# Patient Record
Sex: Male | Born: 1950
Health system: Southern US, Community
[De-identification: ages and names within clinical notes are randomized; demographics above are authoritative.]

## PROBLEM LIST (undated history)

## (undated) DIAGNOSIS — Z923 Personal history of irradiation: Secondary | ICD-10-CM

## (undated) DIAGNOSIS — N200 Calculus of kidney: Secondary | ICD-10-CM

## (undated) DIAGNOSIS — E119 Type 2 diabetes mellitus without complications: Secondary | ICD-10-CM

## (undated) DIAGNOSIS — H409 Unspecified glaucoma: Secondary | ICD-10-CM

## (undated) DIAGNOSIS — R221 Localized swelling, mass and lump, neck: Secondary | ICD-10-CM

## (undated) HISTORY — PX: AMPUTATION: SHX166

## (undated) HISTORY — PX: TOE SURGERY: SHX1073

## (undated) HISTORY — PX: NASAL SEPTUM SURGERY: SHX37

---

## 1983-06-04 DIAGNOSIS — N200 Calculus of kidney: Secondary | ICD-10-CM

## 1983-06-04 HISTORY — DX: Calculus of kidney: N20.0

## 1998-01-30 ENCOUNTER — Inpatient Hospital Stay: Admission: EM | Admit: 1998-01-30 | Discharge: 1998-01-31 | Payer: Self-pay | Admitting: *Deleted

## 1998-02-02 ENCOUNTER — Encounter: Admission: RE | Admit: 1998-02-02 | Discharge: 1998-02-02 | Payer: Self-pay | Admitting: Internal Medicine

## 1998-02-07 ENCOUNTER — Encounter: Admission: RE | Admit: 1998-02-07 | Discharge: 1998-05-08 | Payer: Self-pay

## 1998-03-20 ENCOUNTER — Encounter: Admission: RE | Admit: 1998-03-20 | Discharge: 1998-03-20 | Payer: Self-pay | Admitting: Hematology and Oncology

## 2001-11-21 ENCOUNTER — Emergency Department (HOSPITAL_COMMUNITY): Admission: EM | Admit: 2001-11-21 | Discharge: 2001-11-21 | Payer: Self-pay | Admitting: Emergency Medicine

## 2003-09-15 ENCOUNTER — Ambulatory Visit (HOSPITAL_COMMUNITY): Admission: RE | Admit: 2003-09-15 | Discharge: 2003-09-15 | Payer: Self-pay | Admitting: Orthopedic Surgery

## 2003-09-15 ENCOUNTER — Ambulatory Visit (HOSPITAL_BASED_OUTPATIENT_CLINIC_OR_DEPARTMENT_OTHER): Admission: RE | Admit: 2003-09-15 | Discharge: 2003-09-15 | Payer: Self-pay | Admitting: Orthopedic Surgery

## 2006-03-04 ENCOUNTER — Ambulatory Visit: Payer: Self-pay | Admitting: Cardiology

## 2006-03-26 ENCOUNTER — Ambulatory Visit: Payer: Self-pay | Admitting: Cardiology

## 2006-07-18 ENCOUNTER — Ambulatory Visit: Payer: Self-pay | Admitting: Cardiology

## 2016-12-24 DIAGNOSIS — R221 Localized swelling, mass and lump, neck: Secondary | ICD-10-CM

## 2016-12-24 DIAGNOSIS — H9113 Presbycusis, bilateral: Secondary | ICD-10-CM | POA: Insufficient documentation

## 2016-12-24 HISTORY — DX: Localized swelling, mass and lump, neck: R22.1

## 2016-12-25 ENCOUNTER — Other Ambulatory Visit: Payer: Self-pay | Admitting: Otolaryngology

## 2016-12-25 DIAGNOSIS — R221 Localized swelling, mass and lump, neck: Secondary | ICD-10-CM

## 2016-12-25 HISTORY — PX: FINE NEEDLE ASPIRATION: SHX406

## 2017-01-03 ENCOUNTER — Ambulatory Visit
Admission: RE | Admit: 2017-01-03 | Discharge: 2017-01-03 | Disposition: A | Discharge status: Home / Self Care | Payer: 59 | Source: Ambulatory Visit | Attending: Otolaryngology | Admitting: Otolaryngology

## 2017-01-03 ENCOUNTER — Other Ambulatory Visit (HOSPITAL_COMMUNITY): Payer: Self-pay | Admitting: Otolaryngology

## 2017-01-03 DIAGNOSIS — R221 Localized swelling, mass and lump, neck: Secondary | ICD-10-CM

## 2017-01-03 DIAGNOSIS — C770 Secondary and unspecified malignant neoplasm of lymph nodes of head, face and neck: Secondary | ICD-10-CM

## 2017-01-03 MED ORDER — IOPAMIDOL (ISOVUE-300) INJECTION 61%
75.0000 mL | Freq: Once | INTRAVENOUS | Status: AC | PRN
  Administered 2017-01-03: 75 mL via INTRAVENOUS

## 2017-01-14 ENCOUNTER — Encounter (HOSPITAL_COMMUNITY)
Admission: RE | Admit: 2017-01-14 | Discharge: 2017-01-14 | Disposition: A | Discharge status: Home / Self Care | Payer: 59 | Source: Ambulatory Visit | Attending: Otolaryngology | Admitting: Otolaryngology

## 2017-01-14 DIAGNOSIS — C770 Secondary and unspecified malignant neoplasm of lymph nodes of head, face and neck: Secondary | ICD-10-CM | POA: Diagnosis present

## 2017-01-14 LAB — GLUCOSE, CAPILLARY: Glucose-Capillary: 171 mg/dL — ABNORMAL HIGH (ref 65–99)

## 2017-01-14 MED ORDER — FLUDEOXYGLUCOSE F - 18 (FDG) INJECTION
9.0000 | Freq: Once | INTRAVENOUS | Status: AC | PRN
  Administered 2017-01-14: 9 via INTRAVENOUS

## 2017-01-16 ENCOUNTER — Encounter: Payer: Self-pay | Admitting: Radiation Oncology

## 2017-01-22 NOTE — Progress Notes (Signed)
Head and Neck Cancer Location of Tumor / Histology:  12/25/16 Diagnosis CONSULT SLIDE, LEFT NECK LEVEL 2 MALIGNANT CELLS PRESENT, CONSISTENT WITH SQUAMOUS CELL CARCINOMA.  Patient presented  months ago with symptoms of: He noticed a lump in his neck recently mid to late June.   Biopsies of  revealed:   Nutrition Status Yes No Comments  Weight changes? []  [x]    Swallowing concerns? []  [x]    PEG? []  [x]     Referrals Yes No Comments  Social Work? []  [x]    Dentistry? []  [x]    Swallowing therapy? []  [x]    Nutrition? []  [x]    Med/Onc? [x]  []  Dr. Irene Limbo 02/11/17   Safety Issues Yes No Comments  Prior radiation? []  [x]    Pacemaker/ICD? []  [x]    Possible current pregnancy? []  [x]    Is the patient on methotrexate? []  [x]     Tobacco/Marijuana/Snuff/ETOH use: He is a former smoker and former smokeless tobacco user, he quit 30 years ago.   Past/Anticipated interventions by otolaryngology, if any: 12/25/16  Dr. Constance Holster performed fine needle aspiration.  No appointments are scheduled.   Past/Anticipated interventions by medical oncology, if any:  Dr. Irene Limbo 02/11/17   Current Complaints / other details:   PET 01/14/17 IMPRESSION: 1. Focal area of FDG uptake in the left lateral pharynx/upper left tonsillar region likely representing the primary neoplasm. 2. Metastatic hypermetabolic left level 2 neck nodes. No other FDG positive nodal stations or contralateral adenopathy. 3. No findings for metastatic disease.  BP (!) 145/78   Pulse 80   Temp 98.2 F (36.8 C)   Ht 5\' 9"  (1.753 m)   Wt 178 lb 3.2 oz (80.8 kg)   SpO2 100% Comment: room air  BMI 26.32 kg/m    Wt Readings from Last 3 Encounters:  01/28/17 178 lb 3.2 oz (80.8 kg)

## 2017-01-23 ENCOUNTER — Encounter: Payer: Self-pay | Admitting: Radiation Oncology

## 2017-01-27 ENCOUNTER — Telehealth: Payer: Self-pay | Admitting: Oncology

## 2017-01-27 NOTE — Telephone Encounter (Signed)
Appt has been scheduled for the pt to see Dr. Alen Blew on 9/7 at 2pm. Pt has agreed to the appt date and time. Address and insurance verified. Letter mailed to the pt.

## 2017-01-28 ENCOUNTER — Ambulatory Visit
Admission: RE | Admit: 2017-01-28 | Discharge: 2017-01-28 | Disposition: A | Discharge status: Home / Self Care | Payer: 59 | Source: Ambulatory Visit | Attending: Radiation Oncology | Admitting: Radiation Oncology

## 2017-01-28 ENCOUNTER — Encounter: Payer: Self-pay | Admitting: Radiation Oncology

## 2017-01-28 ENCOUNTER — Encounter: Payer: Self-pay | Admitting: *Deleted

## 2017-01-28 ENCOUNTER — Other Ambulatory Visit: Payer: Self-pay | Admitting: Radiation Oncology

## 2017-01-28 DIAGNOSIS — C090 Malignant neoplasm of tonsillar fossa: Secondary | ICD-10-CM

## 2017-01-28 HISTORY — DX: Localized swelling, mass and lump, neck: R22.1

## 2017-01-28 HISTORY — DX: Calculus of kidney: N20.0

## 2017-01-28 HISTORY — DX: Unspecified glaucoma: H40.9

## 2017-01-28 HISTORY — DX: Type 2 diabetes mellitus without complications: E11.9

## 2017-01-28 NOTE — Progress Notes (Signed)
Oncology Nurse Navigator Documentation  Met with patient during initial consult with Dr. Isidore Moos.  He was accompanied by his wife and dtr.  1. Introduced myself as their Navigator, explained my role as a member of the Care Team.   2. Provided New Patient Information packet, discussed contents:  Contact information for physician(s), myself, other members of the Care Team.  Advance Directive information ([place] blue pamphlet with LCSW contact info)  Fall Prevention Patient Safety Plan  Appointment [place] sheet  [facility] map with highlight of [place]  SLP information sheet 3. Provided introductory explanation of radiation treatment including SIM planning and purpose of Aquaplast head and shoulder mask, showed them example.   4. Provided and discussed education handouts for PEG and PAC.  5. Provided a tour of SIM and Tomo areas, explained treatment and arrival procedures. 6. I encouraged them to contact me with questions/concerns as treatments/procedures begin.  They verbalized understanding of information provided.    Gayleen Orem, RN, BSN, [place] [facility] Oncology Nurse [place] at [place] [phone]

## 2017-01-28 NOTE — Progress Notes (Signed)
Radiation Oncology         [phone] ________________________________  Initial outpatient Consultation  Name: Tracy [name] MRN: [id number]  Date: 01/28/2017  DOB: 1950/08/11  SW:FUXNAT, Tracy Na, MD  [name], MD    REFERRING PHYSICIAN: [name], MD  DIAGNOSIS:    ICD-10-CM   1. 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 male who presented with lump to his left sided neck that he noticed in mid-late June 2018.  Subsequently, the patient saw Dr. Constance Holster who performed a biopsy of his left neck on 12/25/16 with results revealing: CONSULT SLIDE, LEFT NECK LEVEL 2 MALIGNANT CELLS PRESENT, CONSISTENT WITH SQUAMOUS CELL CARCINOMA. p16 status not available  Pertinent imaging thus far includes CT Soft tissue neck W contrast performed on 01/03/17 revealing an abnormally enlarged, solid left level 2 lymph node up to 4.3 cm corresponds to the palpable area of concern. Other left level 2 and level 3 nodes are normal by size criteria but asymmetrically increased. No contralateral lymphadenopathy. No primary tumor identified. Tracy [name] is metastatic nodal disease from an occult pharyngeal/laryngeal primary. Less likely differential considerations are lymphoproliferative disorder, lymphoma, or metastatic disease from a regional skin cancer or a distant primary. Tiny right apical lung nodules appear postinflammatory.  Pt also had a PET performed on 01/14/17 revealing Focal area of FDG uptake in the left lateral pharynx/upper left tonsillar region likely representing the primary neoplasm. Metastatic hypermetabolic left level 2 neck nodes. No other FDG positive  nodal stations or contralateral adenopathy. No findings for metastatic disease.    I personally reviewed his imaging.   Tracy [name] presents today from [place], accompanied with his wife and daughter for a consultation. Pt is overall doing well and reports transient tingling in his jaw that has resolved at [name] time. He voices concern today for the role that radiation therapy would play in his treatment of his cancer. 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 Pt's mother was dx with melanoma on the roof of her mouth when she was 68 and had a recurrence where she was given a prosthetic. She eventually died a few years later from lung cancer. He is currently not taking any thyroid medication.   Swallowing issues, if any: None  Weight Changes: None  Pain status: Pt denies any pain at [name] time.   Other symptoms: Pt endorses transient mild jaw tingling that has resolved  Tobacco history, if any: He is a former smoker and former smokeless tobacco user, he quit 30 years ago.  ETOH abuse, if any: None  Prior cancers, if any: Pt denies any past hx of cancer.                                       PREVIOUS RADIATION THERAPY: No  PAST MEDICAL HISTORY:  has a past medical history of Diabetes mellitus ([place]); Glaucoma; Kidney stones (1985); and Neck mass (12/24/2016).    PAST SURGICAL HISTORY: Past Surgical History:  Procedure Laterality Date  . AMPUTATION Right    5th digit,  1/2 finger  . FINE NEEDLE ASPIRATION  12/25/2016   Left neck, Level 2  . NASAL SEPTUM SURGERY     remotely  . TOE SURGERY     broken toe with bone ground down.     FAMILY HISTORY: family history is not on file.  SOCIAL HISTORY:  reports that he quit smoking about 31 years ago. His smoking use included Cigarettes. He has a 10.00 pack-year smoking history. He quit smokeless tobacco use about 31 years ago. His smokeless tobacco use included Chew. He reports that he does not drink alcohol or use  drugs.  ALLERGIES: Patient has no known allergies.  MEDICATIONS:  Current Outpatient Prescriptions  Medication Sig Dispense Refill  . aspirin EC 81 MG tablet Take by mouth.    . canagliflozin (INVOKANA) 300 MG TABS tablet     . cholecalciferol (VITAMIN D) 1000 units tablet Take 1,000 Units by mouth daily.    Marland Kitchen glipiZIDE (GLUCOTROL) 10 MG tablet Take 10 mg by mouth daily before breakfast.    . polyethylene glycol powder (GLYCOLAX/MIRALAX) powder     . timolol (TIMOPTIC) 0.5 % ophthalmic solution     . predniSONE (DELTASONE) 50 MG tablet      No current facility-administered medications for [name] encounter.     REVIEW OF SYSTEMS: A 10+ POINT REVIEW OF SYSTEMS WAS OBTAINED including neurology, dermatology, psychiatry, cardiac, respiratory, lymph, extremities, GI, GU, Musculoskeletal, constitutional,  HEENT.  All pertinent positives are noted in the HPI.  All others are negative.   PHYSICAL EXAM:  height is 5\' 9"  (1.753 m) and weight is 178 lb 3.2 oz (80.8 kg). His temperature is 98.2 F (36.8 C). His blood pressure is 145/78 (abnormal) and his pulse is 80. His oxygen saturation is 100%.   General: Alert and oriented, in no acute distress HEENT: Head is normocephalic. Extraocular movements are intact. Oropharynx: Clear. No lesions. Metal work noted in teeth, with a couple of missing teeth. Neck: Level 2 region of the left neck at the angle of the mandible with a 2.5 cm palpable mass in greatest dimension. No other palpable masses.   Heart: Regular in rate and rhythm with no murmurs, rubs or gallops  Chest: Clear to auscultation bilaterally, with no rhonchi, wheezes, or rales. Abdomen: Soft, nontender, nondistended, with no rigidity or guarding. Extremities: No cyanosis or edema. Lymphatics: see Neck Exam Skin: No concerning lesions. Musculoskeletal: symmetric strength and muscle tone throughout. Neurologic: Cranial nerves II through XII are grossly intact. No obvious focalities. Speech is  fluent. Coordination is intact. Psychiatric: Judgment and insight are intact. Affect is appropriate.   ECOG = 0   LABORATORY DATA:  No results found for: WBC, HGB, HCT, MCV, PLT CMP  No results found for: [name], K, CL, CO2, GLUCOSE, BUN, CREATININE, CALCIUM, PROT, ALBUMIN, AST, ALT, ALKPHOS, BILITOT, GFRNONAA, GFRAA       RADIOGRAPHY: Ct Soft Tissue Neck W Contrast  Result Date: 01/03/2017 CLINICAL DATA:  66 year old male with left submandibular region neck mass discovered 1 month ago. Percutaneous biopsy 2 weeks ago positive for malignancy. Creatinine was obtained on site at [place] at [address] Results: Creatinine 1.0 mg/dL. EXAM: CT NECK WITH CONTRAST TECHNIQUE: Multidetector CT imaging of the neck was performed using the standard protocol following the bolus administration of intravenous contrast. CONTRAST:  60mL ISOVUE-300 IOPAMIDOL (ISOVUE-300) INJECTION 61% COMPARISON:  None. FINDINGS: Pharynx and larynx: Normal larynx.  Normal hypopharynx. Palatine tonsils are mildly lobulated but appear symmetric and within normal  limits. There are right greater than left postinflammatory calcifications of the tonsillar pillars. The nasopharynx appears normal. Negative parapharyngeal and retropharyngeal spaces. Salivary glands: Sublingual space, right submandibular gland and parotid glands are normal. The left parotid gland is normal aside from being abutted by an abnormal left level IIa lymph node, detailed below. Thyroid: Negative. Lymph nodes: Solitary enlarged, rounded, and mildly heterogeneous lymph node centered at the left IIa station measures 20 x 20 x 43 mm (AP by transverse by CC), and corresponds to the marked area of clinical concern. No internal cystic or necrotic change. Other left level 2 nodes are diminutive, but are asymmetrically increased compared to right level 2 nodes (series 3, image 45). Nearby left level 1 B nodes appear symmetric and normal. Elongated but 7 mm short  axis left level 3 nodal tissue is also asymmetrically increased (series 3, image 74 and sagittal image 69. Level 4 nodes appear symmetric and within normal limits. Vascular: Major vascular structures in the neck and at the skullbase are patent including the left internal jugular vein. Mild left greater than right carotid bifurcation atherosclerosis. Limited intracranial: Negative. Visualized orbits: Not included. Mastoids and visualized paranasal sinuses: Clear. Skeleton: Elongation of bilateral stylohyoid ligament calcification. No acute or suspicious osseous lesion identified in the neck or at the skullbase. Upper chest: Tiny 1-2 mm apical nodules on the right appear to be postinflammatory. Otherwise negative lung apices. 6 mm short axis right peritracheal lymph node at the thoracic inlet is within normal limits. Other visible mediastinal nodes are normal. IMPRESSION: 1. Abnormally enlarged, solid left level 2 lymph node up to 4.3 cm corresponds to the palpable area of concern. Other left level 2 and level 3 nodes are normal by size criteria but asymmetrically increased. No contralateral lymphadenopathy. No primary tumor identified. 2. Tracy [name] is metastatic nodal disease from an occult pharyngeal/laryngeal primary. Less likely differential considerations are lymphoproliferative disorder, lymphoma, or metastatic disease from a regional skin cancer or a distant primary. 3. Tiny right apical lung nodules appear postinflammatory. Electronically Signed   By: Genevie Ann M.D.   On: 01/03/2017 12:48   Nm Pet Image Initial (pi) Skull Base To Thigh  Result Date: 01/14/2017 CLINICAL DATA:  Initial treatment strategy for left neck adenopathy. EXAM: NUCLEAR MEDICINE PET SKULL BASE TO THIGH TECHNIQUE: 9.0 mCi F-18 FDG was injected intravenously. Full-ring PET imaging was performed from the skull base to thigh after the radiotracer. CT data was obtained and used for attenuation correction and anatomic localization. FASTING  BLOOD GLUCOSE:  Value: 171 mg/dl COMPARISON:  Neck CT 01/03/2017 FINDINGS: NECK: The enlarged level 2 lymph nodes in the left neck identified on the prior CT scan are hypermetabolic. The dominant 2 cm lesion has an SUV max of 18.2. No other FDG positive nodal stations are demonstrated. No contralateral lymphadenopathy. Asymmetric FDG uptake noted in the left lateral phalanx/upper left tonsillar region with SUV max of 5.0. [name] is likely the primary neoplasm. Even in retrospect I do not see a definite lesion on the CT scan. CHEST: No hypermetabolic mediastinal or hilar nodes. No suspicious pulmonary nodules on the CT scan. ABDOMEN/PELVIS: No abnormal hypermetabolic activity within the liver, pancreas, adrenal glands, or spleen. No hypermetabolic lymph nodes in the abdomen or pelvis. SKELETON: No focal hypermetabolic activity to suggest skeletal metastasis. IMPRESSION: 1. Focal area of FDG uptake in the left lateral pharynx/upper left tonsillar region likely representing the primary neoplasm. 2. Metastatic hypermetabolic left level 2 neck nodes. No other FDG positive nodal  stations or contralateral adenopathy. 3. No findings for metastatic disease. Electronically Signed   By: Marijo Sanes M.D.   On: 01/14/2017 13:44      IMPRESSION/PLAN: [name] is a delightful patient with head and neck cancer.  It appears on PET to be a L tonsil primary though further tissue may be indicated. Presumed HPV +  I recommend evaluation with ENT surgeon for [facility] at [facility]. If the patient is a candidate for surgery, [name] might cure him without further treatment. He knows he may still need to receive approximately 6 weeks of post-operative radiation therapy if certain risk factors are determined,  but less likely to need chemotherapy. If he is not a candidate for surgery, then he will need to receive 7 weeks of radiation therapy and medical oncology will likely recommend concurrent chemotherapy. I will wait on further information from the  patient surgeon at [facility] after consult.  We discussed the potential risks, benefits, and side effects of radiotherapy. We talked in detail about acute and late effects. We discussed that some of the most bothersome acute effects may be mucositis, dysgeusia, salivary changes, skin irritation, hair loss, dehydration, weight loss and fatigue. We talked about late effects which include but are not necessarily limited to dysphagia, hypothyroidism, nerve injury, spinal cord injury, xerostomia, trismus, and neck edema. No guarantees of treatment were given. A consent form was signed and placed in the patient's medical record. The patient is enthusiastic about proceeding with treatment if needed. I look forward to participating in the patient's care.    We also discussed that the treatment of head and neck cancer is a multidisciplinary process to maximize treatment outcomes and quality of life. For [name] reasons the following referrals have been or will be made:    Pt will be discussed at ENT tumor board on 02/05/17.  Referred to [facility].  Pt has an appointment scheduled with Dr. Irene Limbo on 02/11/17.  Dentistry for dental evaluation, assessment of the radiation fields if needed, and /or advice on reducing risk of cavities, osteoradionecrosis, or other oral issues.   __________________________________________   [name], MD   [name] document serves as a record of services personally performed by [name], MD. It was created on her behalf by Margit Banda, a trained medical scribe. The creation of [name] record is based on the scribe's personal observations and the provider's statements to them. [name] document has been checked and approved by the attending provider.

## 2017-01-29 ENCOUNTER — Telehealth: Payer: Self-pay | Admitting: *Deleted

## 2017-01-29 ENCOUNTER — Other Ambulatory Visit: Payer: Self-pay | Admitting: Radiation Oncology

## 2017-01-29 DIAGNOSIS — C090 Malignant neoplasm of tonsillar fossa: Secondary | ICD-10-CM | POA: Insufficient documentation

## 2017-01-29 NOTE — Telephone Encounter (Signed)
CALLED PATIENT TO INFORM OF APPT. WITH DR. Vivien Presto ON 02-05-17- ARRIVAL TIME - 2:45 PM, SPOKE WITH PATIENT'S WIFE DEBBIE AND SHE IS AWARE OF THIS APPT.

## 2017-01-29 NOTE — Telephone Encounter (Signed)
Oncology Nurse Navigator Documentation  Received call from patient's wife indicating he has an surgical consult with Dr. Conley Canal, [facility], 9/5 1445. Dr. Isidore Moos updated.  Gayleen Orem, RN, BSN, [place] [facility]ck Oncology Nurse [place] at [place] [phone]

## 2017-02-05 ENCOUNTER — Encounter: Payer: Self-pay | Admitting: Hematology

## 2017-02-06 ENCOUNTER — Telehealth: Payer: Self-pay | Admitting: Hematology

## 2017-02-06 NOTE — Telephone Encounter (Signed)
Appt has been rescheduled by the pt's wife to 9/19 at 1pm. Mrs. Luallen says the pt is having a bx done that day and possible removal of his tonsils.

## 2017-02-11 ENCOUNTER — Ambulatory Visit: Payer: 59 | Admitting: Hematology

## 2017-02-11 DIAGNOSIS — C801 Malignant (primary) neoplasm, unspecified: Secondary | ICD-10-CM | POA: Insufficient documentation

## 2017-02-17 NOTE — Progress Notes (Signed)
HEMATOLOGY/ONCOLOGY CONSULTATION NOTE  Date of Service: 02/19/2017  Patient Care Team: [name], MD as PCP - General (Family Medicine) [name], MD as Consulting Physician (Otolaryngology) [name], MD as Attending Physician (Radiation Oncology) [name], RN as Oncology Nurse Navigator  CHIEF COMPLAINTS/PURPOSE OF CONSULTATION:  Squamous Cell Carcinoma of the Neck   HISTORY OF PRESENTING ILLNESS:  Tracy [name] is a wonderful 66 y.o. male who has been referred to [place] by Dr. [name], his ENT, for evaluation and management of Squamous Cell Carcinoma of the head and Neck. He was accompanied by his family wife and daughter.   Patient has a smoking in the past, diabetes and is otherwise in good overall health.  According to the Mr. Sciuto, he had a knot in July and went to PCP for exam. Was prescribed prednisone and referred to ENT. He went to Dr. Constance Holster and upon exam he had enlarged lymph nodes and got a needle biopsy on 12/25/2016 which showed squamous cell carcinoma concerning for likely head and neck primary.  Patient was seen by Dr Isidore Moos in radiation oncology and had a PET which was done on 01/14/17 and Focal area of FDG uptake in the left lateral pharynx/upper left tonsillar region likely representing the primary neoplasm. 2. Metastatic hypermetabolic left level 2 neck nodes. No other FDG positive nodal stations or contralateral adenopathy.  He was referred to Dr Conley Canal at [facility] for TORS and underwent surgery on 02/11/17, by. Dr. [name] at [facility], but pathology results are still pending.   Since surgery he has pain with swallowing, He had an ED visit for pain mx and IVF for dehydration. He notes that his throat pain is much improved now and he is eating better. Feels that his left upper neck mass is slightly larger.   He smoked cigarettes about 1PPD for about 10 years. He stopped smoking in 1982.   MEDICAL HISTORY:  Past Medical  History:  Diagnosis Date  . Diabetes mellitus ([place])   . Glaucoma   . Kidney stones 1985  . Neck mass 12/24/2016    SURGICAL HISTORY: Past Surgical History:  Procedure Laterality Date  . 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 FINE NEEDLE ASPIRATION  12/25/2016   Left neck, Level 2  . NASAL SEPTUM SURGERY     remotely  . TOE SURGERY     broken toe with bone ground down.     SOCIAL HISTORY: Social History   Social History  . Marital status: Married    Spouse name: N/A  . Number of children: N/A  . Years of education: N/A   Occupational History  . Not on file.   Social History Main Topics  . Smoking status: Former Smoker    Packs/day: 1.00    Years: 10.00    Types: Cigarettes    Quit date: 06/03/1985  . Smokeless tobacco: Former Systems developer    Types: Chew    Quit date: 06/03/1985  . Alcohol use No  . Drug use: No  . Sexual activity: Not on file   Other Topics Concern  . Not on file   Social History Narrative  . No narrative on file    FAMILY HISTORY: His mother had mucosal melanoma and father had prostate cancer. His sister had stage 4 pancreatic cancer.  His mother and father had DM.  His maternal grandfather had leukemia.  His maternal uncle had brain cancer.   ALLERGIES:  has  No Known Allergies.  MEDICATIONS:  Current Outpatient Prescriptions  Medication Sig Dispense Refill  . aspirin EC 81 MG tablet Take by mouth.    . canagliflozin (INVOKANA) 300 MG TABS tablet     . cholecalciferol (VITAMIN D) 1000 units tablet Take 1,000 Units by mouth daily.    Marland Kitchen glipiZIDE (GLUCOTROL) 10 MG tablet Take 10 mg by mouth daily before breakfast.    . polyethylene glycol powder (GLYCOLAX/MIRALAX) powder     . predniSONE (DELTASONE) 50 MG tablet     . timolol (TIMOPTIC) 0.5 % ophthalmic solution      No current facility-administered medications for this visit.     REVIEW OF SYSTEMS:    10 Point review of Systems was done is negative except as noted  above.  PHYSICAL EXAMINATION: ECOG PERFORMANCE STATUS: 1 - Symptomatic but completely ambulatory  . Vitals:   02/19/17 1238 02/19/17 1242  BP: 135/70 118/83  Pulse: 100 63  Resp: 18 20  Temp: 99.3 F (37.4 C) 98.4 F (36.9 C)  SpO2: 97% 98%   Filed Weights   02/19/17 1238  Weight: 247 lb 3.2 oz (112.1 kg)   .Body mass index is 36.51 kg/m.  GENERAL:alert, in no acute distress and comfortable SKIN: no acute rashes, no significant lesions EYES: conjunctiva are pink and non-injected, sclera anicteric OROPHARYNX: MMM, no exudates, (+) whitish slough in surgical area around left tonsillar fossa and some surrounding erythema. NECK: supple, no JVD , left upper anterior neck mass LYMPH:  no palpable lymphadenopathy in the axillary or inguinal regions, left upper cervical LNadenopathy noted LUNGS: clear to auscultation b/l with normal respiratory effort HEART: regular rate & rhythm ABDOMEN:  normoactive bowel sounds , non tender, not distended. Extremity: no pedal edema PSYCH: alert & oriented x 3 with fluent speech NEURO: no focal motor/sensory deficits  LABORATORY DATA:  I have reviewed the data as listed  . CBC Latest Ref Rng & Units 02/19/2017  WBC 4.0 - 10.3 10e3/uL 5.9  Hemoglobin 13.0 - 17.1 g/dL 16.4  Hematocrit 38.4 - 49.9 % 46.6  Platelets 140 - 400 10e3/uL 241    . CMP Latest Ref Rng & Units 02/19/2017  Glucose 70 - 140 mg/dl 126  BUN 7.0 - 26.0 mg/dL 18.3  Creatinine 0.7 - 1.3 mg/dL 1.1  Sodium 136 - 145 mEq/L 140  Potassium 3.5 - 5.1 mEq/L 4.3  CO2 22 - 29 mEq/L 25  Calcium 8.4 - 10.4 mg/dL 9.8  Total Protein 6.4 - 8.3 g/dL 7.8  Total Bilirubin 0.20 - 1.20 mg/dL 0.39  Alkaline Phos 40 - 150 U/L 77  AST 5 - 34 U/L 28  ALT 0 - 55 U/L 32     PATHOLOGY   Diagnosis 12/25/16   CONSULT SLIDE, LEFT NECK LEVEL 2 MALIGNANT CELLS PRESENT, CONSISTENT WITH SQUAMOUS CELL CARCINOMA.  RADIOGRAPHIC STUDIES: I have personally reviewed the radiological images as  listed and agreed with the findings in the report. No results found.   PET 01/14/17 IMPRESSION: 1. Focal area of FDG uptake in the left lateral pharynx/upper left tonsillar region likely representing the primary neoplasm. 2. Metastatic hypermetabolic left level 2 neck nodes. No other FDG positive nodal stations or contralateral adenopathy. 3. No findings for metastatic disease.   CT Soft Tissue W Contrast 01/03/17 IMPRESSION: 1. Abnormally enlarged, solid left level 2 lymph node up to 4.3 cm corresponds to the palpable area of concern. Other left level 2 and level 3 nodes are normal by size criteria but asymmetrically  increased. No contralateral lymphadenopathy. No primary tumor identified. 2. Burtis Junes this is metastatic nodal disease from an occult pharyngeal/laryngeal primary. Less likely differential considerations are lymphoproliferative disorder, lymphoma, or metastatic disease from a regional skin cancer or a distant primary. 3. Tiny right apical lung nodules appear postinflammatory.   ASSESSMENT & PLAN:  Tracy [name] is a 66 y.o. male with   #1 Squamous Cell Carcinoma of the Neck metastatic to left level 2 lymph nodes. Likely left lateral pharynx/upper left tonsillar region likely representing the primary neoplasm based on PET/CT. No PET/CT evidence of distant metastases. Assumed related to HPV. Only 10pk-yr h/o smoking and quit in 1982  #2 Post-operative pain -- now controlled.   Plan -I discussed the available lab and imaging results with the patient and his family and answered their questions. -patient is s/p TORS surgery to confirm if the left tonsils/BOT area if the primary lesion as suggested by PET/CT and to determine resectability of primary tumor. -if the primary tumor and metastatic left neck LNadenopathy is represents surgically resectable disease -- surgery with left neck dissection will likely be the primary treatment with consideration of adjuvant radiation  therapy based on pathology results (positive margins/ENE/number of LN etc. -would recommend P16 testing on pathology sample. -if primary site is unidentified based on pathology or represents unresectable disease might need to consider a treatment approach of concurrent chemotherapy + Radiation. -patient will continue f/u with Dr Conley Canal (ENT Sx) and Dr Isidore Moos (Radiation oncology). -has available pain medication with adequate pain control at this time. -pending pathology results from surgery on 02/11/2017.  RTC with Dr Irene Limbo tentatively in 4 weeks with further recommendations based on pathology results.  All of the patients questions were answered with apparent satisfaction. The patient knows to call the clinic with any problems, questions or concerns.  I spent 60 minutes counseling the patient face to face. The total time spent in the appointment was 80 minutes and more than 50% was on counseling and direct patient cares.  This document serves as a record of services personally performed by Sullivan Lone, MD. It was created on her behalf by Joslyn Devon, a trained medical scribe. The creation of this record is based on the scribe's personal observations and the provider's statements to them. This document has been checked and approved by the attending provider.   Sullivan Lone MD [place] AAHIVMS [facility] Hematology/Oncology Physician [facility]  (Office):       [phone] (Work cell):  [phone] (Fax):           [phone]  02/19/2017 1:02 PM

## 2017-02-18 ENCOUNTER — Telehealth: Payer: Self-pay | Admitting: *Deleted

## 2017-02-18 NOTE — Telephone Encounter (Signed)
Oncology Nurse Navigator Documentation  Spoke with patient wife, she confirmed understanding of tomorrow's 1300 appt with Dr. Irene Limbo. She reported husband has been having notable post-L tonsillectomy pain, analgesics being Rxed by [facility] Dr. Jenne Campus office. She stated results of surgical bx are expected tomorrow, they are to call Dr. Jenne Campus office. She voiced understanding post- surgical adjuvant RT or chemotherapy will be based on surgical biopsy results and Dr. Jenne Campus recommendation.  Gayleen Orem, RN, BSN, [place] Oncology Nurse [place] at [place] [phone]

## 2017-02-19 ENCOUNTER — Ambulatory Visit (HOSPITAL_BASED_OUTPATIENT_CLINIC_OR_DEPARTMENT_OTHER): Payer: 59 | Admitting: Hematology

## 2017-02-19 ENCOUNTER — Telehealth: Payer: Self-pay | Admitting: *Deleted

## 2017-02-19 ENCOUNTER — Telehealth: Payer: Self-pay | Admitting: Hematology

## 2017-02-19 ENCOUNTER — Ambulatory Visit (HOSPITAL_BASED_OUTPATIENT_CLINIC_OR_DEPARTMENT_OTHER): Payer: 59

## 2017-02-19 ENCOUNTER — Encounter: Payer: Self-pay | Admitting: Hematology

## 2017-02-19 VITALS — BP 118/83 | HR 63 | Temp 98.4°F | Resp 20 | Ht 69.0 in | Wt 247.2 lb

## 2017-02-19 DIAGNOSIS — C770 Secondary and unspecified malignant neoplasm of lymph nodes of head, face and neck: Secondary | ICD-10-CM | POA: Diagnosis not present

## 2017-02-19 DIAGNOSIS — Z87891 Personal history of nicotine dependence: Secondary | ICD-10-CM | POA: Diagnosis not present

## 2017-02-19 DIAGNOSIS — C760 Malignant neoplasm of head, face and neck: Secondary | ICD-10-CM

## 2017-02-19 DIAGNOSIS — E119 Type 2 diabetes mellitus without complications: Secondary | ICD-10-CM | POA: Diagnosis not present

## 2017-02-19 LAB — CBC & DIFF AND RETIC
BASO%: 0.3 % (ref 0.0–2.0)
Basophils Absolute: 0 10*3/uL (ref 0.0–0.1)
EOS ABS: 0.2 10*3/uL (ref 0.0–0.5)
EOS%: 3.2 % (ref 0.0–7.0)
HCT: 46.6 % (ref 38.4–49.9)
HEMOGLOBIN: 16.4 g/dL (ref 13.0–17.1)
IMMATURE RETIC FRACT: 0.6 % — AB (ref 3.00–10.60)
LYMPH%: 34.5 % (ref 14.0–49.0)
MCH: 32 pg (ref 27.2–33.4)
MCHC: 35.2 g/dL (ref 32.0–36.0)
MCV: 91 fL (ref 79.3–98.0)
MONO#: 0.5 10*3/uL (ref 0.1–0.9)
MONO%: 8.1 % (ref 0.0–14.0)
NEUT%: 53.9 % (ref 39.0–75.0)
NEUTROS ABS: 3.2 10*3/uL (ref 1.5–6.5)
Platelets: 241 10*3/uL (ref 140–400)
RBC: 5.12 10*6/uL (ref 4.20–5.82)
RDW: 12.4 % (ref 11.0–14.6)
RETIC %: 0.84 % (ref 0.80–1.80)
RETIC CT ABS: 43.01 10*3/uL (ref 34.80–93.90)
WBC: 5.9 10*3/uL (ref 4.0–10.3)
lymph#: 2 10*3/uL (ref 0.9–3.3)

## 2017-02-19 LAB — COMPREHENSIVE METABOLIC PANEL
ALBUMIN: 3.8 g/dL (ref 3.5–5.0)
ALK PHOS: 77 U/L (ref 40–150)
ALT: 32 U/L (ref 0–55)
AST: 28 U/L (ref 5–34)
Anion Gap: 11 mEq/L (ref 3–11)
BILIRUBIN TOTAL: 0.39 mg/dL (ref 0.20–1.20)
BUN: 18.3 mg/dL (ref 7.0–26.0)
CO2: 25 mEq/L (ref 22–29)
Calcium: 9.8 mg/dL (ref 8.4–10.4)
Chloride: 104 mEq/L (ref 98–109)
Creatinine: 1.1 mg/dL (ref 0.7–1.3)
EGFR: 69 mL/min/{1.73_m2} — ABNORMAL LOW (ref >90)
GLUCOSE: 126 mg/dL (ref 70–140)
Potassium: 4.3 mEq/L (ref 3.5–5.1)
SODIUM: 140 meq/L (ref 136–145)
Total Protein: 7.8 g/dL (ref 6.4–8.3)

## 2017-02-19 NOTE — Patient Instructions (Signed)
Thank you for choosing [facility] to provide your oncology and hematology care.  To afford each patient quality time with our providers, please arrive 30 minutes before your scheduled appointment time.  If you arrive late for your appointment, you may be asked to reschedule.  We strive to give you quality time with our providers, and arriving late affects you and other patients whose appointments are after yours.   If you are a no show for multiple scheduled visits, you may be dismissed from the clinic at the providers discretion.    Again, thank you for choosing [facility], our hope is that these requests will decrease the amount of time that you wait before being seen by our physicians.  ______________________________________________________________________  Should you have questions after your visit to the [facility], please contact our office at [phone] between the hours of 8:30 and 4:30 p.m.    Voicemails left after 4:30p.m will not be returned until the following business day.    For prescription refill requests, please have your pharmacy contact us directly.  Please also try to allow 48 hours for prescription requests.    Please contact the scheduling department for questions regarding scheduling.  For scheduling of procedures such as PET scans, CT scans, MRI, Ultrasound, etc please contact central scheduling at [phone].    Resources For Cancer Patients and Caregivers:   Oncolink.org:  A wonderful resource for patients and healthcare providers for information regarding your disease, ways to tract your treatment, what to expect, etc.     American Cancer Society:  800-227-2345  Can help patients locate various types of support and financial assistance  Cancer Care: 1-800-813-HOPE (4673) Provides financial assistance, online support groups, medication/co-pay assistance.    [place] DSS:  336-641-3447 Where to apply for food  stamps, Medicaid, and utility assistance  Medicare Rights Center: 800-333-4114 Helps people with Medicare understand their rights and benefits, navigate the Medicare system, and secure the quality healthcare they deserve  SCAT: 336-333-6589 [place] [employer]'s shared-ride transportation service for eligible riders who have a disability that prevents them from riding the fixed route bus.    For additional information on assistance programs please contact our social worker:   Grier Hock/Abigail Elmore:  [phone]            

## 2017-02-19 NOTE — Telephone Encounter (Signed)
Gave patient avs and calendar with appts per 9/19 los °

## 2017-02-19 NOTE — Telephone Encounter (Signed)
Per Dr. Irene Limbo called [facility] to obtain recent pathology results from Dr. [name].  No answer, LVM @ [phone] requesting information.  Call back and fax number provided.

## 2017-03-05 ENCOUNTER — Telehealth: Payer: Self-pay | Admitting: *Deleted

## 2017-03-05 NOTE — Telephone Encounter (Signed)
Oncology Nurse Navigator Documentation  Called pt to obtain update s/p 9/11 tonsillectomy [facility] with Dr Conley Canal and biopsy results reported 9/21.  Spoke with wife as he was at work. She reported he is having 10/12 L neck LN dissection, adjuvant tmt (RT) will be based on biopsy results. She voiced understanding I will monitor his progress via [place], she agreed to provide me updates. Drs. Irene Limbo and [place] updated.  Gayleen Orem, RN, BSN, [place] Oncology Nurse [place] at [place] [phone]

## 2017-03-12 DIAGNOSIS — R221 Localized swelling, mass and lump, neck: Secondary | ICD-10-CM | POA: Diagnosis not present

## 2017-03-12 DIAGNOSIS — E119 Type 2 diabetes mellitus without complications: Secondary | ICD-10-CM | POA: Insufficient documentation

## 2017-03-12 DIAGNOSIS — C099 Malignant neoplasm of tonsil, unspecified: Secondary | ICD-10-CM | POA: Diagnosis not present

## 2017-03-14 DIAGNOSIS — C770 Secondary and unspecified malignant neoplasm of lymph nodes of head, face and neck: Secondary | ICD-10-CM | POA: Diagnosis present

## 2017-03-14 DIAGNOSIS — Z7984 Long term (current) use of oral hypoglycemic drugs: Secondary | ICD-10-CM | POA: Diagnosis not present

## 2017-03-14 DIAGNOSIS — Z79899 Other long term (current) drug therapy: Secondary | ICD-10-CM | POA: Diagnosis not present

## 2017-03-14 DIAGNOSIS — G893 Neoplasm related pain (acute) (chronic): Secondary | ICD-10-CM | POA: Diagnosis present

## 2017-03-14 DIAGNOSIS — R221 Localized swelling, mass and lump, neck: Secondary | ICD-10-CM | POA: Diagnosis not present

## 2017-03-14 DIAGNOSIS — Z79891 Long term (current) use of opiate analgesic: Secondary | ICD-10-CM | POA: Diagnosis not present

## 2017-03-14 DIAGNOSIS — Z87891 Personal history of nicotine dependence: Secondary | ICD-10-CM | POA: Diagnosis not present

## 2017-03-14 DIAGNOSIS — E119 Type 2 diabetes mellitus without complications: Secondary | ICD-10-CM | POA: Diagnosis present

## 2017-03-14 DIAGNOSIS — C099 Malignant neoplasm of tonsil, unspecified: Secondary | ICD-10-CM | POA: Diagnosis not present

## 2017-03-14 DIAGNOSIS — Z7982 Long term (current) use of aspirin: Secondary | ICD-10-CM | POA: Diagnosis not present

## 2017-03-17 ENCOUNTER — Telehealth: Payer: Self-pay | Admitting: *Deleted

## 2017-03-17 NOTE — Telephone Encounter (Signed)
A user error has taken place: encounter opened in error, closed for administrative reasons.

## 2017-03-17 NOTE — Telephone Encounter (Signed)
Pt's wife called stating pt just had biopsy done Friday.  Pt is in pain from surgery and would prefer not to come for 10/17 MD visit if biopsy results will not be resulted.  Would prefer to come early the following week in order to ensure biopsy results are resulted.  Message sent to scheduler to contact pt to schedule apt Monday 10/22.

## 2017-03-18 DIAGNOSIS — C7989 Secondary malignant neoplasm of other specified sites: Secondary | ICD-10-CM | POA: Diagnosis not present

## 2017-03-18 DIAGNOSIS — C099 Malignant neoplasm of tonsil, unspecified: Secondary | ICD-10-CM | POA: Diagnosis not present

## 2017-03-18 DIAGNOSIS — Z9889 Other specified postprocedural states: Secondary | ICD-10-CM | POA: Diagnosis not present

## 2017-03-19 ENCOUNTER — Other Ambulatory Visit: Payer: 59

## 2017-03-19 ENCOUNTER — Ambulatory Visit: Payer: 59 | Admitting: Hematology

## 2017-03-21 ENCOUNTER — Telehealth: Payer: Self-pay | Admitting: Hematology

## 2017-03-21 NOTE — Telephone Encounter (Signed)
Scheduled patient per 10/15 sch message. Mailed calendar to patient.

## 2017-03-24 DIAGNOSIS — C760 Malignant neoplasm of head, face and neck: Secondary | ICD-10-CM | POA: Diagnosis not present

## 2017-03-24 DIAGNOSIS — Z483 Aftercare following surgery for neoplasm: Secondary | ICD-10-CM | POA: Diagnosis not present

## 2017-03-27 ENCOUNTER — Telehealth: Payer: Self-pay | Admitting: *Deleted

## 2017-03-31 ENCOUNTER — Other Ambulatory Visit: Payer: Self-pay | Admitting: *Deleted

## 2017-03-31 ENCOUNTER — Ambulatory Visit: Payer: Medicare Other | Admitting: Hematology

## 2017-03-31 ENCOUNTER — Other Ambulatory Visit: Payer: Medicare Other

## 2017-03-31 ENCOUNTER — Telehealth: Payer: Self-pay | Admitting: Hematology

## 2017-03-31 DIAGNOSIS — C090 Malignant neoplasm of tonsillar fossa: Secondary | ICD-10-CM

## 2017-03-31 NOTE — Telephone Encounter (Signed)
Faxed completed FMLA forms to [employer] attention Claims Department on 03/31/2017 to [phone].  Attempted to call patient at number provided [phone]) and number was no longer in service.

## 2017-04-02 ENCOUNTER — Telehealth: Payer: Self-pay | Admitting: Hematology

## 2017-04-02 NOTE — Telephone Encounter (Signed)
Spoke with patient's wife regarding appts scheduled per 10/29 sch msg.

## 2017-04-04 NOTE — Telephone Encounter (Signed)
Oncology Nurse Navigator Documentation  Per email from Biagio Borg, [place] for Dr. Aida Puffer, [facility], patient does not require adjuvant RT per Tumor Board discussion.  Dr. Isidore Moos notified.  Gayleen Orem, RN, BSN, [place] Oncology Nurse [place] at [place] [phone]

## 2017-04-14 DIAGNOSIS — C770 Secondary and unspecified malignant neoplasm of lymph nodes of head, face and neck: Secondary | ICD-10-CM | POA: Diagnosis not present

## 2017-04-14 DIAGNOSIS — C099 Malignant neoplasm of tonsil, unspecified: Secondary | ICD-10-CM | POA: Diagnosis not present

## 2017-04-15 DIAGNOSIS — Z6824 Body mass index (BMI) 24.0-24.9, adult: Secondary | ICD-10-CM | POA: Diagnosis not present

## 2017-04-15 DIAGNOSIS — E1165 Type 2 diabetes mellitus with hyperglycemia: Secondary | ICD-10-CM | POA: Diagnosis not present

## 2017-04-21 ENCOUNTER — Ambulatory Visit (HOSPITAL_BASED_OUTPATIENT_CLINIC_OR_DEPARTMENT_OTHER): Payer: Medicare Other | Admitting: Hematology

## 2017-04-21 ENCOUNTER — Encounter: Payer: Self-pay | Admitting: Hematology

## 2017-04-21 ENCOUNTER — Telehealth: Payer: Self-pay | Admitting: Hematology

## 2017-04-21 ENCOUNTER — Other Ambulatory Visit (HOSPITAL_BASED_OUTPATIENT_CLINIC_OR_DEPARTMENT_OTHER): Payer: Medicare Other

## 2017-04-21 DIAGNOSIS — G8918 Other acute postprocedural pain: Secondary | ICD-10-CM

## 2017-04-21 DIAGNOSIS — C760 Malignant neoplasm of head, face and neck: Secondary | ICD-10-CM

## 2017-04-21 DIAGNOSIS — C770 Secondary and unspecified malignant neoplasm of lymph nodes of head, face and neck: Secondary | ICD-10-CM

## 2017-04-21 DIAGNOSIS — C090 Malignant neoplasm of tonsillar fossa: Secondary | ICD-10-CM

## 2017-04-21 DIAGNOSIS — E119 Type 2 diabetes mellitus without complications: Secondary | ICD-10-CM

## 2017-04-21 LAB — CBC WITH DIFFERENTIAL/PLATELET
BASO%: 0.8 % (ref 0.0–2.0)
BASOS ABS: 0 10*3/uL (ref 0.0–0.1)
EOS%: 3.3 % (ref 0.0–7.0)
Eosinophils Absolute: 0.2 10*3/uL (ref 0.0–0.5)
HCT: 44 % (ref 38.4–49.9)
HEMOGLOBIN: 15 g/dL (ref 13.0–17.1)
LYMPH%: 35.3 % (ref 14.0–49.0)
MCH: 31 pg (ref 27.2–33.4)
MCHC: 34.2 g/dL (ref 32.0–36.0)
MCV: 90.8 fL (ref 79.3–98.0)
MONO#: 0.4 10*3/uL (ref 0.1–0.9)
MONO%: 7.7 % (ref 0.0–14.0)
NEUT%: 52.9 % (ref 39.0–75.0)
NEUTROS ABS: 2.7 10*3/uL (ref 1.5–6.5)
Platelets: 221 10*3/uL (ref 140–400)
RBC: 4.85 10*6/uL (ref 4.20–5.82)
RDW: 12.9 % (ref 11.0–14.6)
WBC: 5.1 10*3/uL (ref 4.0–10.3)
lymph#: 1.8 10*3/uL (ref 0.9–3.3)

## 2017-04-21 LAB — COMPREHENSIVE METABOLIC PANEL
ALBUMIN: 3.8 g/dL (ref 3.5–5.0)
ALK PHOS: 83 U/L (ref 40–150)
ALT: 41 U/L (ref 0–55)
AST: 24 U/L (ref 5–34)
Anion Gap: 9 mEq/L (ref 3–11)
BUN: 17.4 mg/dL (ref 7.0–26.0)
CO2: 25 meq/L (ref 22–29)
Calcium: 9.3 mg/dL (ref 8.4–10.4)
Chloride: 98 mEq/L (ref 98–109)
Creatinine: 1.4 mg/dL — ABNORMAL HIGH (ref 0.7–1.3)
EGFR: 50 mL/min/{1.73_m2} — AB (ref >60)
GLUCOSE: 505 mg/dL — AB (ref 70–140)
POTASSIUM: 4.6 meq/L (ref 3.5–5.1)
SODIUM: 132 meq/L — AB (ref 136–145)
Total Bilirubin: 0.39 mg/dL (ref 0.20–1.20)
Total Protein: 7.1 g/dL (ref 6.4–8.3)

## 2017-04-21 LAB — MAGNESIUM: MAGNESIUM: 2.2 mg/dL (ref 1.5–2.5)

## 2017-04-21 NOTE — Telephone Encounter (Signed)
Scheduled appt per 11/19 los - Gave patient AVS and calender per los.  

## 2017-04-21 NOTE — Progress Notes (Addendum)
HEMATOLOGY/ONCOLOGY CLINIC NOTE  Date of Service: 04/21/2017  Patient Care Team: [name], MD as PCP - General (Family Medicine) [name], MD as Consulting Physician (Otolaryngology) [name], MD as Attending Physician (Radiation Oncology) [name], RN as Oncology Nurse Navigator  CHIEF COMPLAINTS/PURPOSE OF CONSULTATION:  Squamous Cell Carcinoma of the Neck  HISTORY OF PRESENTING ILLNESS:  -plz see previous note for details on HPI  Tracy [name] is a wonderful 66 y.o. male who has been referred to [place] by Dr. [name], his ENT, for evaluation and management of Squamous Cell Carcinoma of the head and Neck. He had a procedure: PR REMOVAL NODES, NECK,CERV MOD RAD PR REMOVAL NODES, NECK,SUPRAHYOIDSELECTIVE NECK DISSECTION on 03/14/2017.   Pt reports today for fu accompanied by his wife and daughter. He states he is doing well s/p neck dissection and tonsil removal and is returning to work next week. He is a Building control surveyor. We discussed pathology results post surgery. He  Had a 1 out of 21 lymph nodes positive with no extracapsular extension.  This case was discussed in the [facility] tumor board and no adjuvant radiation or chemo/rad was recommended. He has had elevated blood sugars postoperatively and has been put on metformin by his primary care physician.  He states he walks an hour per day. His wife states noticing a slightly change in her husband's voice after surgery and was recommended to discuss this with his ENT to r/o any concerns for nerve paresis/injury related to surgery.  On review of systems, pt reports constipation, tension in his left shoulder muscle and denies any acute new concerns.    MEDICAL HISTORY:  Past Medical History:  Diagnosis Date  . Diabetes mellitus ([place])   . Glaucoma   . Kidney stones 1985  . Neck mass 12/24/2016    SURGICAL HISTORY: Past Surgical History:  Procedure Laterality Date  . AMPUTATION Right      5th digit, 1/2 finger  . FINE NEEDLE ASPIRATION  12/25/2016   Left neck, Level 2  . NASAL SEPTUM SURGERY     remotely  . TOE SURGERY     broken toe with bone ground down.     SOCIAL HISTORY: Social History   Socioeconomic History  . Marital status: Married    Spouse name: Not on file  . Number of children: Not on file  . Years of education: Not on file  . Highest education level: Not on file  Social Needs  . Financial resource strain: Not on file  . Food insecurity - worry: Not on file  . Food insecurity - inability: Not on file  . Transportation needs - medical: Not on file  . Transportation needs - non-medical: Not on file  Occupational History  . Not on file  Tobacco Use  . Smoking status: Former Smoker    Packs/day: 1.00    Years: 10.00    Pack years: 10.00    Types: Cigarettes    Last attempt to quit: 06/03/1985    Years since quitting: 31.9  . Smokeless tobacco: Former Systems developer    Types: [place] date: 06/03/1985  Substance and Sexual Activity  . Alcohol use: No  . Drug use: No  . Sexual activity: Not on file  Other Topics Concern  . Not on file  Social History Narrative  . Not on file    FAMILY HISTORY: His mother had mucosal melanoma and father had prostate cancer. His sister  had stage 4 pancreatic cancer.  His mother and father had DM.  His maternal grandfather had leukemia.  His maternal uncle had brain cancer.   ALLERGIES:  has No Known Allergies.  MEDICATIONS:  Current Outpatient Medications  Medication Sig Dispense Refill  . aspirin EC 81 MG tablet Take by mouth.    . cholecalciferol (VITAMIN D) 1000 units tablet Take 1,000 Units by mouth daily.    Marland Kitchen glipiZIDE (GLUCOTROL) 10 MG tablet Take 10 mg by mouth daily before breakfast.    . polyethylene glycol powder (GLYCOLAX/MIRALAX) powder     . timolol (TIMOPTIC) 0.5 % ophthalmic solution     . canagliflozin (INVOKANA) 300 MG TABS tablet      No current facility-administered medications for  this visit.     REVIEW OF SYSTEMS:    10 Point review of Systems was done is negative except as noted above.  PHYSICAL EXAMINATION: ECOG PERFORMANCE STATUS: 1 - Symptomatic but completely ambulatory  .VS reviewed in [place], in no acute distress and comfortable SKIN: no acute rashes, no significant lesions EYES: conjunctiva are pink and non-injected, sclera anicteric OROPHARYNX: MMM, no exudates NECK: supple, no JVD , left upper anterior neck mass LYMPH:  no palpable lymphadenopathy in the axillary or inguinal regions, healing surgical scar on neck LUNGS: clear to auscultation b/l with normal respiratory effort HEART: regular rate & rhythm ABDOMEN:  normoactive bowel sounds , non tender, not distended. Extremity: no pedal edema PSYCH: alert & oriented x 3 with fluent speech NEURO: no focal motor/sensory deficits  LABORATORY DATA:  I have reviewed the data as listed  . CBC Latest Ref Rng & Units 04/21/2017 02/19/2017  WBC 4.0 - 10.3 10e3/uL 5.1 5.9  Hemoglobin 13.0 - 17.1 g/dL 15.0 16.4  Hematocrit 38.4 - 49.9 % 44.0 46.6  Platelets 140 - 400 10e3/uL 221 241    . CMP Latest Ref Rng & Units 04/21/2017 02/19/2017  Glucose 70 - 140 mg/dl 505(H) 126  BUN 7.0 - 26.0 mg/dL 17.4 18.3  Creatinine 0.7 - 1.3 mg/dL 1.4(H) 1.1  Sodium 136 - 145 mEq/L 132(L) 140  Potassium 3.5 - 5.1 mEq/L 4.6 4.3  CO2 22 - 29 mEq/L 25 25  Calcium 8.4 - 10.4 mg/dL 9.3 9.8  Total Protein 6.4 - 8.3 g/dL 7.1 7.8  Total Bilirubin 0.20 - 1.20 mg/dL 0.39 0.39  Alkaline Phos 40 - 150 U/L 83 77  AST 5 - 34 U/L 24 28  ALT 0 - 55 U/L 41 32     PATHOLOGY   Diagnosis 12/25/16   CONSULT SLIDE, LEFT NECK LEVEL 2 MALIGNANT CELLS PRESENT, CONSISTENT WITH SQUAMOUS CELL CARCINOMA. 04/16/2017  03/17/2017  02/20/2017   RADIOGRAPHIC STUDIES: I have personally reviewed the radiological images as listed and agreed with the findings in the report. No results found.   PET 01/14/17 IMPRESSION: 1.  Focal area of FDG uptake in the left lateral pharynx/upper left tonsillar region likely representing the primary neoplasm. 2. Metastatic hypermetabolic left level 2 neck nodes. No other FDG positive nodal stations or contralateral adenopathy. 3. No findings for metastatic disease.   CT Soft Tissue W Contrast 01/03/17 IMPRESSION: 1. Abnormally enlarged, solid left level 2 lymph node up to 4.3 cm corresponds to the palpable area of concern. Other left level 2 and level 3 nodes are normal by size criteria but asymmetrically increased. No contralateral lymphadenopathy. No primary tumor identified. 2. Burtis Junes this is metastatic nodal disease from an occult pharyngeal/laryngeal primary. Less likely differential considerations are  lymphoproliferative disorder, lymphoma, or metastatic disease from a regional skin cancer or a distant primary. 3. Tiny right apical lung nodules appear postinflammatory.   ASSESSMENT & PLAN:   CHILTON SALLADE is a 66 y.o. 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 Only 10pk-yr h/o smoking and quit in 1982  #2 Post-operative pain -- now controlled.   Plan -I discussed the available lab and imaging results with the patient and his family and answered their questions. -patient is s/p TORS surgery to confirm that the left tonsils/BOT area is the primary lesion as suggested by PET/CT . -he has subsequently had radical lymph node dissection of his neck which shows 1 out of 21 lymph nodes with metastatic disease 5.1 cm without ENE. -Patient is healing well from surgery. -Based on pathologic staging and input from [facility] tumor board patient has not been recommended any adjuvant radiation or chemoradiation. --patient will continue f/u with Dr Bonney Leitz for ENT f/u and Dr Isidore Moos (Radiation oncology) as per their  recommendations -has available pain medication with adequate pain control at this time. -Local recurrence exam 2-3 months for 2 yrs with ENT  #3 Diabetes Being managed by PCP. I called after clinic given high Blood sugar levels of >500 Patients wife reports rpt Blood sugars at home have been in the 200's Plan -recommended drinking atleast 60OZ of water daily -close f/u with PCP to optimize DM2 management. Will likely need something more than metformin to control his blood sugars.  RTC with Dr Irene Limbo in 6 months with labs.  All of the patients questions were answered with apparent satisfaction. The patient knows to call the clinic with any problems, questions or concerns.  I spent 20 minutes counseling the patient face to face. The total time spent in the appointment was 25 minutes and more than 50% was on counseling and direct patient cares.    Sullivan Lone MD [place] AAHIVMS [facility] Hematology/Oncology Physician [facility]  (Office):       [phone] (Work cell):  [phone] (Fax):           [phone]  04/21/2017 3:31 PM  This document serves as a record of services personally performed by Sullivan Lone, MD. It was created on his behalf by Alean Rinne, a trained medical scribe. The creation of this record is based on the scribe's personal observations and the provider's statements to them.   .I have reviewed the above documentation for accuracy and completeness, and I agree with the above. Brunetta Genera MD MS

## 2017-04-22 LAB — PHOSPHORUS: PHOSPHORUS: 3.9 mg/dL (ref 2.5–4.5)

## 2017-04-28 ENCOUNTER — Other Ambulatory Visit: Payer: Medicare Other

## 2017-04-28 ENCOUNTER — Ambulatory Visit: Payer: Medicare Other | Admitting: Hematology

## 2017-05-16 DIAGNOSIS — I10 Essential (primary) hypertension: Secondary | ICD-10-CM | POA: Diagnosis not present

## 2017-05-16 DIAGNOSIS — Z6826 Body mass index (BMI) 26.0-26.9, adult: Secondary | ICD-10-CM | POA: Diagnosis not present

## 2017-05-16 DIAGNOSIS — E1165 Type 2 diabetes mellitus with hyperglycemia: Secondary | ICD-10-CM | POA: Diagnosis not present

## 2017-05-16 DIAGNOSIS — E782 Mixed hyperlipidemia: Secondary | ICD-10-CM | POA: Diagnosis not present

## 2017-05-16 DIAGNOSIS — Z1389 Encounter for screening for other disorder: Secondary | ICD-10-CM | POA: Diagnosis not present

## 2017-06-30 DIAGNOSIS — C779 Secondary and unspecified malignant neoplasm of lymph node, unspecified: Secondary | ICD-10-CM | POA: Diagnosis not present

## 2017-06-30 DIAGNOSIS — C099 Malignant neoplasm of tonsil, unspecified: Secondary | ICD-10-CM | POA: Diagnosis not present

## 2017-06-30 DIAGNOSIS — R590 Localized enlarged lymph nodes: Secondary | ICD-10-CM | POA: Diagnosis not present

## 2017-07-16 DIAGNOSIS — R221 Localized swelling, mass and lump, neck: Secondary | ICD-10-CM | POA: Diagnosis not present

## 2017-07-16 DIAGNOSIS — C099 Malignant neoplasm of tonsil, unspecified: Secondary | ICD-10-CM | POA: Diagnosis not present

## 2017-07-16 DIAGNOSIS — C770 Secondary and unspecified malignant neoplasm of lymph nodes of head, face and neck: Secondary | ICD-10-CM | POA: Diagnosis not present

## 2017-07-16 DIAGNOSIS — C098 Malignant neoplasm of overlapping sites of tonsil: Secondary | ICD-10-CM | POA: Diagnosis not present

## 2017-07-28 DIAGNOSIS — J209 Acute bronchitis, unspecified: Secondary | ICD-10-CM | POA: Diagnosis not present

## 2017-07-28 DIAGNOSIS — Z6827 Body mass index (BMI) 27.0-27.9, adult: Secondary | ICD-10-CM | POA: Diagnosis not present

## 2017-07-31 DIAGNOSIS — C4442 Squamous cell carcinoma of skin of scalp and neck: Secondary | ICD-10-CM | POA: Diagnosis not present

## 2017-07-31 DIAGNOSIS — C099 Malignant neoplasm of tonsil, unspecified: Secondary | ICD-10-CM | POA: Diagnosis not present

## 2017-07-31 DIAGNOSIS — R221 Localized swelling, mass and lump, neck: Secondary | ICD-10-CM | POA: Diagnosis not present

## 2017-08-15 DIAGNOSIS — C778 Secondary and unspecified malignant neoplasm of lymph nodes of multiple regions: Secondary | ICD-10-CM | POA: Diagnosis present

## 2017-08-15 DIAGNOSIS — Z85818 Personal history of malignant neoplasm of other sites of lip, oral cavity, and pharynx: Secondary | ICD-10-CM | POA: Diagnosis not present

## 2017-08-15 DIAGNOSIS — C760 Malignant neoplasm of head, face and neck: Secondary | ICD-10-CM | POA: Diagnosis not present

## 2017-08-15 DIAGNOSIS — C770 Secondary and unspecified malignant neoplasm of lymph nodes of head, face and neck: Secondary | ICD-10-CM | POA: Diagnosis not present

## 2017-08-15 DIAGNOSIS — Z87891 Personal history of nicotine dependence: Secondary | ICD-10-CM | POA: Diagnosis not present

## 2017-08-15 DIAGNOSIS — E119 Type 2 diabetes mellitus without complications: Secondary | ICD-10-CM | POA: Diagnosis present

## 2017-08-15 DIAGNOSIS — C801 Malignant (primary) neoplasm, unspecified: Secondary | ICD-10-CM | POA: Diagnosis present

## 2017-08-16 DIAGNOSIS — C4442 Squamous cell carcinoma of skin of scalp and neck: Secondary | ICD-10-CM | POA: Insufficient documentation

## 2017-08-19 DIAGNOSIS — C4442 Squamous cell carcinoma of skin of scalp and neck: Secondary | ICD-10-CM | POA: Diagnosis not present

## 2017-08-25 ENCOUNTER — Encounter: Payer: Self-pay | Admitting: Radiation Oncology

## 2017-08-25 DIAGNOSIS — C4442 Squamous cell carcinoma of skin of scalp and neck: Secondary | ICD-10-CM | POA: Diagnosis not present

## 2017-08-25 DIAGNOSIS — Z85818 Personal history of malignant neoplasm of other sites of lip, oral cavity, and pharynx: Secondary | ICD-10-CM | POA: Diagnosis not present

## 2017-08-25 DIAGNOSIS — Z4802 Encounter for removal of sutures: Secondary | ICD-10-CM | POA: Diagnosis not present

## 2017-08-27 NOTE — Progress Notes (Signed)
Head and Neck Cancer Location of Tumor / Histology:  12/25/16 Diagnosis CONSULT SLIDE, LEFT NECK LEVEL 2 MALIGNANT CELLS PRESENT, CONSISTENT WITH SQUAMOUS CELL CARCINOMA.  02/11/17 FINAL PATHOLOGIC DIAGNOSIS MICROSCOPIC EXAMINATION AND DIAGNOSIS  A.LEFT TONSIL, TONSILLECTOMY: Invasive squamous cell carcinoma, HPV-related. Margins are negative for malignancy. See Comment.  B.DEEP SUPERIOR POLE, EXCISION: Negative for malignancy.  03/14/17 FINAL PATHOLOGIC DIAGNOSIS MICROSCOPIC EXAMINATION AND DIAGNOSIS  A. "LEFT LEVEL 1B", LYMPHADENECTOMY: Two lymph nodes, negative for metastatic carcinoma (0/2). Salivary gland parenchyma with no significant diagnostic abnormality.  B. "LEFT LEVEL 2B", LYMPHADENECTOMY: One lymph node, negative for metastatic carcinoma (0/1).  C. "LEFT LEVEL 2A, 3, 4", LYMPHADENECTOMY: One of twenty lymph nodes, positive for metastatic carcinoma (1/20). Largest metastatic focus measures 5.1 cm. Extranodal extension is not identified.  COMMENT: The metastatic carcinoma found in one lymph node in Specimen C is histologically consistent with squamous cell carcinoma  04/14/17 HPV testing is performed on block [id number] A3 from (~95% tumor). TestResults Reference Values HPVHR type 16, PCRPOSITIVENegative HPVHR type 18, PCRNegativeNegative HPV other HR types, PCR NegativeNegative (Negative for one of the other High Risk HPV types:31, 33, 35, 39, 45, 51, 52, 56, 58, 59, 66 and 68)  08/15/17 FINAL PATHOLOGIC DIAGNOSIS MICROSCOPIC EXAMINATION AND DIAGNOSIS   "LEFT LEVEL 3&5 LYMPH NODE", EXCISON: Metastatic squamous cell carcinoma involving one of eight lymph nodes (1/8).  Patient presented  months ago with symptoms of: He noticed a lump in his neck recently mid to late June. He next had a PET scan in  February that demonstrated a left neck nodule.   Biopsies of Left Tonsil revealed: Invasive squamous cell carcinoma.   Nutrition Status Yes No Comments  Weight changes? [x]  []  He reports gaining about 20 lbs since August. There  Is a weight documented on 02/19/17 of 247 lbs which the patient denies was ever his weight.   Swallowing concerns? []  [x]    PEG? []  [x]     Referrals Yes No Comments  Social Work? []  [x]    Dentistry? []  [x]    Swallowing therapy? []  [x]    Nutrition? []  [x]    Med/Onc? [x]  []  Dr. Irene Limbo 04/21/17, scheduled next for 10/20/17   Safety Issues Yes No Comments  Prior radiation? []  [x]    Pacemaker/ICD? []  [x]    Possible current pregnancy? []  [x]    Is the patient on methotrexate? []  [x]     Tobacco/Marijuana/Snuff/ETOH use: He is a former smoker and former smokeless tobacco user, he quit 30 years ago. He does not drink alcohol.   Past/Anticipated interventions by otolaryngology, if any: 03/24/17 He saw Biagio Borg PA.  Plan:  Dr. Conley Canal will present his case at Tumor Board and call him with the result.  Stop antibiotic ointment. May apply Vaseline.  Return to clinic in 4-6 weeks for recheck. Baseline CT Neck in January.  Addendum: Dr. Conley Canal spoke by phone with [name] yesterday. His cancer is Stage 1 and he does not require adjuvant therapy. He will need to see Dr. Conley Canal in 4-6 weeks and complete a baseline CT neck in january  Electronically signed by: Laurelyn Sickle Plasser, PA-C 03/27/2017 2:00 PM  08/15/17 Revision left modified radical neck dissection by Dr. Conley Canal  08/25/17 Laurelyn Sickle Plasser, PA Plan:  Radiation consult scheduled at [facility].  He will follow up post treatment. Instructed to call for any questions or concerns.  Electronically signed by: Laurelyn Sickle Plasser, PA-C 08/30/2017 12:17 PM    Past/Anticipated interventions by medical oncology, if any:  04/21/17 Dr. Irene Limbo Plan -I  discussed  the available lab and imaging results with the patient and his family and answered their questions. -patient is s/p TORS surgery to confirm that the left tonsils/BOT area is the primary lesion as suggested by PET/CT . -he has subsequently had radical lymph node dissection of his neck which shows 1 out of 21 lymph nodes with metastatic disease 5.1 cm without ENE. -Patient is healing well from surgery. -Based on pathologic staging and input from [facility] tumor board patient has not been recommended any adjuvant radiation or chemoradiation. --patient will continue f/u with Dr Bonney Leitz for ENT f/u and Dr Isidore Moos (Radiation oncology) as per their recommendations -has available pain medication with adequate pain control at this time. -Local recurrence exam 2-3 months for 2 yrs with ENT  Next appointment scheduled is 10/20/17 with Dr. Irene Limbo    Current Complaints / other details:   He denies any other concerns at this time.   BP 125/78   Pulse 72   Temp 98 F (36.7 C)   Ht 5\' 9"  (1.753 m)   Wt 192 lb 9.6 oz (87.4 kg)   SpO2 98% Comment: room air  BMI 28.44 kg/m    Wt Readings from Last 3 Encounters:  09/02/17 192 lb 9.6 oz (87.4 kg)  02/19/17 247 lb 3.2 oz (112.1 kg)  01/28/17 178 lb 3.2 oz (80.8 kg)  He does not believe the weight documented on 02/19/18 is accurate. He reports he has never weighed that amount.

## 2017-08-28 ENCOUNTER — Telehealth: Payer: Self-pay | Admitting: *Deleted

## 2017-08-28 NOTE — Telephone Encounter (Signed)
Oncology Nurse Navigator Documentation  Spoke with pt wife, reintroduced myself as their navigator, confirmed understanding of referral from Dr. Conley Canal [facility] s/p 3/15 surgery.  Confirmed understanding of 09/02/17 9:30 appt, reviewed procedures for registration/arrival to Radiation Waiting.  She voiced understanding.  Gayleen Orem, RN, BSN Head & Neck Oncology Nurse [place] at [place] [phone]

## 2017-09-01 ENCOUNTER — Ambulatory Visit
Admission: RE | Admit: 2017-09-01 | Discharge: 2017-09-01 | Disposition: A | Discharge status: Home / Self Care | Payer: Self-pay | Source: Ambulatory Visit | Attending: Radiation Oncology | Admitting: Radiation Oncology

## 2017-09-01 ENCOUNTER — Other Ambulatory Visit: Payer: Self-pay | Admitting: Radiation Oncology

## 2017-09-01 DIAGNOSIS — C770 Secondary and unspecified malignant neoplasm of lymph nodes of head, face and neck: Secondary | ICD-10-CM | POA: Diagnosis not present

## 2017-09-01 DIAGNOSIS — C801 Malignant (primary) neoplasm, unspecified: Secondary | ICD-10-CM | POA: Diagnosis not present

## 2017-09-01 DIAGNOSIS — C090 Malignant neoplasm of tonsillar fossa: Secondary | ICD-10-CM

## 2017-09-02 ENCOUNTER — Encounter: Payer: Self-pay | Admitting: *Deleted

## 2017-09-02 ENCOUNTER — Encounter: Payer: Self-pay | Admitting: Radiation Oncology

## 2017-09-02 ENCOUNTER — Ambulatory Visit
Admission: RE | Admit: 2017-09-02 | Discharge: 2017-09-02 | Disposition: A | Discharge status: Home / Self Care | Payer: Medicare Other | Source: Ambulatory Visit | Attending: Radiation Oncology | Admitting: Radiation Oncology

## 2017-09-02 ENCOUNTER — Other Ambulatory Visit: Payer: Self-pay

## 2017-09-02 VITALS — BP 125/78 | HR 72 | Temp 98.0°F | Ht 69.0 in | Wt 192.6 lb

## 2017-09-02 DIAGNOSIS — Z510 Encounter for antineoplastic radiation therapy: Secondary | ICD-10-CM | POA: Insufficient documentation

## 2017-09-02 DIAGNOSIS — R635 Abnormal weight gain: Secondary | ICD-10-CM

## 2017-09-02 DIAGNOSIS — Z888 Allergy status to other drugs, medicaments and biological substances status: Secondary | ICD-10-CM | POA: Diagnosis not present

## 2017-09-02 DIAGNOSIS — Z87891 Personal history of nicotine dependence: Secondary | ICD-10-CM | POA: Diagnosis not present

## 2017-09-02 DIAGNOSIS — C090 Malignant neoplasm of tonsillar fossa: Secondary | ICD-10-CM

## 2017-09-02 DIAGNOSIS — Z79899 Other long term (current) drug therapy: Secondary | ICD-10-CM | POA: Insufficient documentation

## 2017-09-02 DIAGNOSIS — Z9889 Other specified postprocedural states: Secondary | ICD-10-CM | POA: Insufficient documentation

## 2017-09-02 DIAGNOSIS — Z1329 Encounter for screening for other suspected endocrine disorder: Secondary | ICD-10-CM

## 2017-09-02 DIAGNOSIS — C779 Secondary and unspecified malignant neoplasm of lymph node, unspecified: Secondary | ICD-10-CM | POA: Diagnosis not present

## 2017-09-02 DIAGNOSIS — Z794 Long term (current) use of insulin: Secondary | ICD-10-CM | POA: Insufficient documentation

## 2017-09-02 DIAGNOSIS — C770 Secondary and unspecified malignant neoplasm of lymph nodes of head, face and neck: Secondary | ICD-10-CM | POA: Diagnosis not present

## 2017-09-02 DIAGNOSIS — F40240 Claustrophobia: Secondary | ICD-10-CM | POA: Insufficient documentation

## 2017-09-02 LAB — URINALYSIS, COMPLETE (UACMP) WITH MICROSCOPIC
Bilirubin Urine: NEGATIVE
GLUCOSE, UA: NEGATIVE mg/dL
Hgb urine dipstick: NEGATIVE
Ketones, ur: NEGATIVE mg/dL
Leukocytes, UA: NEGATIVE
Nitrite: NEGATIVE
PH: 6 (ref 5.0–8.0)
PROTEIN: NEGATIVE mg/dL
Specific Gravity, Urine: 1.005 (ref 1.005–1.030)

## 2017-09-02 LAB — TSH: TSH: 1.341 u[IU]/mL (ref 0.320–4.118)

## 2017-09-02 NOTE — Progress Notes (Signed)
Oncology Nurse Navigator Documentation  Met with Tracy [name] and his wife during consult with Dr. Isidore Moos to discuss adjuvant RT s/p 3/15 L neck dissection revision w/ Dr. Conley Canal, [facility].  We had previously met 8/28 when he was referred by Dr. Isidore Moos to [facility] for surgical consult. They voiced understanding of 6 weeks RT, he requested open-face mask. We discussed his attendance at next week's H&N [place]; I provided 0900 arrival to Radiation Waiting following lobby registration. They voiced understanding of referral to Dental Medicine for pre-radiotherapy evaluation. I encouraged them to call me with questions/concerns.  Gayleen Orem, RN, BSN Head & Neck Oncology Nurse [place] at [place] [phone]

## 2017-09-02 NOTE — Progress Notes (Signed)
Radiation Oncology         [phone] ________________________________  Name: Tracy [name] MRN: [id number]  Date: 09/02/2017  DOB: 10-26-1950  Follow-Up Visit Note  Outpatient  CC: Tracy Bis, MD  [name], *  Diagnosis and Prior Radiotherapy:    ICD-10-CM   1. Cancer of tonsillar fossa (HCC) C09.0 TSH    Ambulatory referral to Social Work    Ambulatory referral to Physical Therapy    Amb Referral to Nutrition and Diabetic E    Referral to Neuro Rehab    Ambulatory referral to Dentistry    CANCELED: Urinalysis, Routine w reflex microscopic  2. Screening for hypothyroidism Z13.29 TSH  3. Weight gain R63.5 TSH   Cancer Staging Cancer of tonsillar fossa ([place]) Staging form: Pharynx - HPV-Mediated Oropharynx, AJCC 8th Edition - Clinical: Stage I (cT1, cN1, cM0, p16: Unknown) - Signed by [name], MD on 01/29/2017 - Pathologic stage from 09/02/2017: Stage I (pT1, pN1, cM0, p16+) - Signed by [name], MD on 09/02/2017   CHIEF COMPLAINT: Here for recurrent tonsil cancer in left neck  Narrative:  The patient returns today for follow up of left tonsil cancer - referred back by Dr Conley Canal  Since consultation with me, he proceeded to left tonsillectomy on 02/11/17 with pathology revealing 0.9cm invasive squamous cell carcinoma, deemed positive for HPV 16. Margins were negative but <69mm from deep margin; negative for LVSI/PNI. Excision of deep superior pole was negative for malignancy.   On 03/14/17 he underwent left level 1B lymphadenectomy which showed 2 lymph nodes negative for metastasis. Salivary gland parenchyma was found to have no significant abnormality. Also negative for malignancy was one lymph node at left level 2B. One of twenty lymph nodes at left level 2A, 3, 4 was positive for metastasis with the largest metastatic focus measuring 5.1 cm. Extranodal extension was not identified. This was histologically consistent with squamous cell carcinoma.    Pertinent imaging thus far includes neck CT from 06/30/17 which revealed no enhancing tissue in the surgical bed to suggest residual or recurrent disease locally. There was a lobulated hyperenhancing left level III lymph node which had increased in size compared to prior outside CT in August with rounded/ovoid morphology and suspicion for nodal metastasis. Left level 5 lymph nodes were not pathologically enlarged but did have increase in size and appeared rounder compared to prior. No necrotic lymph nodes were see.  PET scan from 07/16/17 showed a hypermetabolic left level III cervical lymph node consistent with metastatic disease. Additional nonhypermetabolic subcentimeter left level V cervical lymph nodes were also noted, but of questionable significance.   I have personally reviewed his most recent imaging.  The patient underwent an additional excision for recurrence on 08/15/17 which showed metastatic squamous cell carcinoma involving 1/8 lymph nodes at left level 3&5. I don't see any mention of extra capsular extension and I have contacted Dr. Conley Canal to see if the pathologist can clarify that.  Of note, the patient is a former smoker and smokeless tobacco user but quit 30 years ago. The patient reports of claustrophobia. He reports weight gain.  ALLERGIES:  is allergic to bismuth subsalicylate.  Meds: Current Outpatient Medications  Medication Sig Dispense Refill  . acetaminophen (TYLENOL) 500 MG tablet Take by mouth.    . insulin NPH-regular Human (NOVOLIN 70/30) (70-30) 100 UNIT/ML injection Inject 32 units subcutaneously every morning and inject 30 units every evening    . timolol (TIMOPTIC) 0.5 % ophthalmic solution  No current facility-administered medications for this encounter.     Physical Findings: The patient is in no acute distress. Patient is alert and oriented.  height is 5\' 9"  (1.753 m) and weight is 192 lb 9.6 oz (87.4 kg). His temperature is 98 F (36.7 C). His  blood pressure is 125/78 and his pulse is 72. His oxygen saturation is 98%. .    General: Alert and oriented, in no acute distress HEENT: Head is normocephalic. Extraocular movements are intact. No visible tumor in oropharynx. He is missing some of his dentition and does have metalwork in many of his teeth. Neck: He has scars over his left neck that are healing well with some associated lymphedema. No palpable adenopathy in the neck. The lymphedema is concentrated upon the dominant left cervical neck scar and there is also some elongated thickening in the left level III region anterior to the smaller scar. Heart: Regular in rate and rhythm with no murmurs, rubs, or gallops. Chest: Clear to auscultation bilaterally, with no rhonchi, wheezes, or rales. Abdomen: Soft, nontender, nondistended, with no rigidity or guarding. Extremities: No cyanosis or edema. Lymphatics: see Neck Exam Skin: No concerning lesions. Musculoskeletal: symmetric strength and muscle tone throughout. Neurologic: Cranial nerves II through XII are grossly intact. No obvious focalities. Speech is fluent. Coordination is intact. Psychiatric: Judgment and insight are intact. Affect is appropriate.   Lab Findings: Lab Results  Component Value Date   WBC 5.1 04/21/2017   HGB 15.0 04/21/2017   HCT 44.0 04/21/2017   MCV 90.8 04/21/2017   PLT 221 04/21/2017   Lab Results  Component Value Date   TSH 1.341 09/02/2017     Radiographic Findings: As above  Impression/Plan: recurrent cancer -  left neck, after surgical treatment of tonsil cancer  It was a pleasure meeting the patient again today though I am very sorry about his recurrence. I anticipate 6 weeks of radiation therapy directed at his neck bilaterally, including site of tonsillectomy. We discussed the risks, benefits, and side effects of radiotherapy. We discussed potential for fatigue, salivary changes, and taste changes earlier in treatment with soreness in  throat, skin irritation over neck, jaw line, and face and hair loss later on in treatment. We discussed injury to normal tissues/ organs as a risk. No guarantees of treatment were given. A consent form was signed and placed in the patient's medical record. The patient is enthusiastic about proceeding with treatment. I look forward to participating in the patient's care.  Will arrange:   Consult with Dr. Enrique Sack for scatter guards and dental exam prior to RT   Will discuss PET scan with tumor board tomorrow (with particular regard for liver) whether MRI is warranted; will order urine sample (bladder wall thickening)   Order Baseline TSH (was normal today)   Refer to  Swallowing therapist, nutritionist, physical therapist, Social worker at our [facility] clinic.   I spent 40 minutes face to face with the patient and more than 50% of that time was spent in counseling and/or coordination of care.    _____________________________________   [name], MD  This document serves as a record of services personally performed by [name], MD. It was created on his behalf by Linward Natal, a trained medical scribe. The creation of this record is based on the scribe's personal observations and the provider's statements to them. This document has been checked and approved by the attending provider.

## 2017-09-03 ENCOUNTER — Encounter (HOSPITAL_COMMUNITY): Payer: Self-pay | Admitting: Dentistry

## 2017-09-03 ENCOUNTER — Ambulatory Visit (HOSPITAL_COMMUNITY): Payer: Self-pay | Admitting: Dentistry

## 2017-09-03 ENCOUNTER — Other Ambulatory Visit: Payer: Self-pay | Admitting: Radiation Oncology

## 2017-09-03 VITALS — BP 136/64 | HR 69 | Temp 98.3°F

## 2017-09-03 DIAGNOSIS — K08409 Partial loss of teeth, unspecified cause, unspecified class: Secondary | ICD-10-CM

## 2017-09-03 DIAGNOSIS — Z01818 Encounter for other preprocedural examination: Secondary | ICD-10-CM

## 2017-09-03 DIAGNOSIS — M2632 Excessive spacing of fully erupted teeth: Secondary | ICD-10-CM

## 2017-09-03 DIAGNOSIS — K06010 Localized gingival recession, unspecified: Secondary | ICD-10-CM

## 2017-09-03 DIAGNOSIS — K029 Dental caries, unspecified: Secondary | ICD-10-CM

## 2017-09-03 DIAGNOSIS — M264 Malocclusion, unspecified: Secondary | ICD-10-CM

## 2017-09-03 DIAGNOSIS — K036 Deposits [accretions] on teeth: Secondary | ICD-10-CM

## 2017-09-03 DIAGNOSIS — K0530 Chronic periodontitis, unspecified: Secondary | ICD-10-CM

## 2017-09-03 DIAGNOSIS — K769 Liver disease, unspecified: Secondary | ICD-10-CM

## 2017-09-03 DIAGNOSIS — K030 Excessive attrition of teeth: Secondary | ICD-10-CM

## 2017-09-03 DIAGNOSIS — C090 Malignant neoplasm of tonsillar fossa: Secondary | ICD-10-CM | POA: Diagnosis not present

## 2017-09-03 DIAGNOSIS — N3289 Other specified disorders of bladder: Secondary | ICD-10-CM

## 2017-09-03 MED ORDER — SODIUM FLUORIDE 1.1 % DT GEL: DENTAL | 99 refills | Status: DC

## 2017-09-03 NOTE — Patient Instructions (Signed)

## 2017-09-03 NOTE — Progress Notes (Signed)
DENTAL CONSULTATION  Date of Consultation:  09/03/2017 Patient Name:   Tracy [name] Date of Birth:   08/13/1950 Medical Record Number: [id number]  VITALS: BP 136/64 (BP Location: Right Arm)   Pulse 69   Temp 98.3 F (36.8 C)   CHIEF COMPLAINT: Patient referred by Dr. Isidore Moos for dental consultation.  HPI: Tracy [name] is a 67 year old male recently previously diagnosed with squamous cell carcinoma of the left tonsillar fossa. Patient underwent  TORS tonsillectomy procedure with Dr. Conley Canal along with neck dissection. Patient with anticipated radiation therapy. Patient now seen as part of a medically necessary pre-radiation therapy dental protocol examination.  The patient currently denies acute toothaches, swellings, or abscesses. Patient was last seen in March 2019 to have a crown recemented. This was with his primary dentist, Dr. Maury Dus, in [place], [place]. Patient is currently scheduled for a dental cleaning tomorrow on 09/04/2017. The patient usually sees the dentist on an every 6 month basis. Patient denies having any partial dentures. Patient denies having any dental phobia.  PROBLEM LIST: Patient Active Problem List   Diagnosis Date Noted  . Cancer of tonsillar fossa ([place]) 01/29/2017    Priority: High    PMH: Past Medical History:  Diagnosis Date  . Diabetes mellitus ([place])   . Glaucoma   . Kidney stones 1985  . Neck mass 12/24/2016    PSH: Past Surgical History:  Procedure Laterality Date  . 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 FINE NEEDLE ASPIRATION  12/25/2016   Left neck, Level 2  . NASAL SEPTUM SURGERY     remotely  . TOE SURGERY     broken toe with bone ground down.     ALLERGIES: Allergies  Allergen Reactions  . Bismuth Subsalicylate Other (See Comments)    ADRxn: Wife states pepto bismol turns his tongue black    MEDICATIONS: Current Outpatient Medications  Medication Sig Dispense Refill  . acetaminophen (TYLENOL)  500 MG tablet Take by mouth.    . insulin NPH-regular Human (NOVOLIN 70/30) (70-30) 100 UNIT/ML injection Inject 32 units subcutaneously every morning and inject 30 units every evening    . timolol (TIMOPTIC) 0.5 % ophthalmic solution      No current facility-administered medications for this visit.     LABS: Lab Results  Component Value Date   WBC 5.1 04/21/2017   HGB 15.0 04/21/2017   HCT 44.0 04/21/2017   MCV 90.8 04/21/2017   PLT 221 04/21/2017      Component Value Date/Time   NA 132 (L) 04/21/2017 1410   K 4.6 04/21/2017 1410   CO2 25 04/21/2017 1410   GLUCOSE 505 (H) 04/21/2017 1410   BUN 17.4 04/21/2017 1410   CREATININE 1.4 (H) 04/21/2017 1410   CALCIUM 9.3 04/21/2017 1410   No results found for: INR, PROTIME No results found for: PTT  SOCIAL HISTORY: Social History   Socioeconomic History  . Marital status: Married    Spouse name: Not on file  . Number of children: Not on file  . Years of education: Not on file  . Highest education level: Not on file  Occupational History  . Not on file  Social Needs  . Financial resource strain: Not on file  . Food insecurity:    Worry: Not on file    Inability: Not on file  . Transportation needs:    Medical: Not on file    Non-medical: Not on file  Tobacco Use  . Smoking  status: Former Smoker    Packs/day: 1.00    Years: 10.00    Pack years: 10.00    Types: Cigarettes    Last attempt to quit: 06/03/1985    Years since quitting: 32.2  . Smokeless tobacco: Former Systems developer    Types: Dare date: 06/03/1985  Substance and Sexual Activity  . Alcohol use: No  . Drug use: No  . Sexual activity: Not on file  Lifestyle  . Physical activity:    Days per week: Not on file    Minutes per session: Not on file  . Stress: Not on file  Relationships  . Social connections:    Talks on phone: Not on file    Gets together: Not on file    Attends religious service: Not on file    Active member of club or organization:  Not on file    Attends meetings of clubs or organizations: Not on file    Relationship status: Not on file  . Intimate partner violence:    Fear of current or ex partner: Not on file    Emotionally abused: Not on file    Physically abused: Not on file    Forced sexual activity: Not on file  Other Topics Concern  . Not on file  Social History Narrative  . Not on file    FAMILY HISTORY: History reviewed. No pertinent family history.  REVIEW OF SYSTEMS: Reviewed with the patient as per History of present illness. Psych: Patient denies having dental phobia.  DENTAL HISTORY: CHIEF COMPLAINT: Patient referred by Dr. Isidore Moos for dental consultation.  HPI: Tracy [name] is a 67 year old male recently previously diagnosed with squamous cell carcinoma of the left tonsillar fossa. Patient underwent  TORS tonsillectomy procedure with Dr. Conley Canal along with neck dissection. Patient with anticipated radiation therapy. Patient now seen as part of a medically necessary pre-radiation therapy dental protocol examination.  The patient currently denies acute toothaches, swellings, or abscesses. Patient was last seen in March 2019 to have a crown recemented. This was with his primary dentist, Dr. Maury Dus, in [place], [place]. Patient is currently scheduled for a dental cleaning tomorrow on 09/04/2017. The patient usually sees the dentist on an every 6 month basis. Patient denies having any partial dentures. Patient denies having any dental phobia.  DENTAL EXAMINATION: GENERAL:  The patient is a well-developed, well-nourished male in no acute distress. HEAD AND NECK:  There is no palpable neck lymphadenopathy. The left neck is consistent with previous neck dissection surgeries in that area. Patient denies acute TMJ symptoms.   INTRAORAL EXAM:  Patient has normal saliva. There is no evidence of oral abscess formation. The patient has a small, mid palatal torus.patient has buccal  exostosis in the area of tooth numbers 12 through 16. DENTITION: The patient is missing tooth numbers 7, 10, 15, 17-19, and 32. Tooth numbers 1 and 16 are impacted.the canine teeth numbers 6 and 11 are in the tooth spaces previously occupied by tooth numbers 7 and 10. Multiple rotated and malpositioned teeth are noted. PERIODONTAL:  The patient has chronic periodontitis with plaque and calculus accumulations, selective areas of gingival recession, and incipient to moderate bone loss. DENTAL CARIES/SUBOPTIMAL RESTORATIONS: The patient may have recurrent caries on the distal of tooth #4. Patient has multiple large amalgam and resin  restorations that could be evaluated for future restoration with a crown. ENDODONTIC:  Patient currently denies acute pulpitis symptoms. I do not see any evidence of  periapical pathology or radiolucency. CROWN AND BRIDGE:  There are multiple crown restorations noted. There are no bridges. The patient could be evaluated for future crown restorations after the radiation therapy. PROSTHODONTIC: The patient denies having partial dentures. OCCLUSION: The patient has a poor occlusal scheme secondary to multiple missing teeth,supra-eruption and drifting of the unopposed teeth into the edentulous areas, multiple diastemas,and lack of replacement of all missing teeth with dental prostheses.  RADIOGRAPHIC INTERPRETATION: An orthopantogram was taken and supplemented with a full series of dental radiographs. There are multiple missing teeth. There are impacted tooth numbers 1 and 16. Multiple diastemas are noted. There is supra-eruption and drifting of the unopposed teeth into the edentulous areas. There is evidence of recent extraction of the lower left molar #19. There is incisal attrition of the mandibular anterior teeth. There is incipient to moderate bone loss noted.   ASSESSMENTS: 1. Squamous cell carcinoma of the tonsillar fossa. 2. Status post tonsillectomy with multiple neck  dissections. 3. Preradiation therapy dental protocol examination 4. Chronic periodontitis with bone loss 5. Accretions 6. Gingival recession 7. Mandibular anterior incisal attrition 8. Questionable recurrent caries of the distal of #4 9. Multiple missing teeth 10. No history of partial dentures 11. Supra-eruption and drifting of the unopposed teeth into the edentulous areas 12. Poor occlusal scheme and malocclusion 13. Mid palatal torus 14. Buccal exostoses in the area tooth numbers 12 through 16.  PLAN/RECOMMENDATIONS: 1. I discussed the risks, benefits, and complications of various treatment options with the patient in relationship to his medical and dental conditions, anticipated radiation therapy, and radiation therapy side effects to include xerostomia, radiation caries, trismus, mucositis, taste changes, gum and jawbone changes, and risk for infection and osteoradionecrosis. We discussed various treatment options to include no treatment, extraction of impacted tooth numbers 1 and 16, pre-prosthetic surgery as indicated, periodontal therapy, dental restorations, root canal therapy, crown and bridge therapy, implant therapy, and replacement of missing teeth as indicated. We also discussed fabrication of upper lower fluoride trays and scatter protection devices. The patient currently wishes to proceed with impressions today for the fabrication of upper lower fluoride trays and scatter protection devices. The patient will follow-up with his primary dentist for a dental cleaning appointment along with possible DO restoration on tooth #4.  Impacted tooth numbers 1 and 16 should be out of the field of primary radiation therapy and will be not be extracted at this time at the request of the patient. A prescription for FluoriSHIELD sodium fluoride therapy has been prescribed and sent to [facility] with refills for one year.   2. Discussion of findings with medical team and  coordination of future medical and dental care as needed.  I spent in excess of  120 minutes during the conduct of this consultation and >50% of this time involved direct face-to-face encounter for counseling and/or coordination of the patient's care.    Lenn Cal, DDS

## 2017-09-08 ENCOUNTER — Telehealth: Payer: Self-pay | Admitting: *Deleted

## 2017-09-08 ENCOUNTER — Ambulatory Visit (HOSPITAL_COMMUNITY): Payer: Medicaid - Dental | Admitting: Dentistry

## 2017-09-08 ENCOUNTER — Encounter (HOSPITAL_COMMUNITY): Payer: Self-pay | Admitting: Dentistry

## 2017-09-08 VITALS — BP 130/78 | HR 60 | Temp 98.2°F

## 2017-09-08 DIAGNOSIS — C090 Malignant neoplasm of tonsillar fossa: Secondary | ICD-10-CM

## 2017-09-08 DIAGNOSIS — Z463 Encounter for fitting and adjustment of dental prosthetic device: Secondary | ICD-10-CM

## 2017-09-08 DIAGNOSIS — Z01818 Encounter for other preprocedural examination: Secondary | ICD-10-CM

## 2017-09-08 NOTE — Patient Instructions (Signed)

## 2017-09-08 NOTE — Progress Notes (Signed)
09/08/2017  Patient Name:   Tracy [name] Date of Birth:   Oct 18, 1950 Medical Record Number: [id number]  BP 130/78 (BP Location: Left Arm)   Pulse 60   Temp 98.2 F (36.8 C)   [name] now presents for insertion of upper and lower fluoride trays and scatter protection devices. Patient was recently seen for a dental cleaning and restoration by Dr. Valetta Mole.  PROCEDURE: Appliances were tried in and adjusted as needed. [place] Trismus device was fabricated 40 mm using 22 sticks. Postop instructions were provided and a written and verbal format concerning the use and care of appliances. All questions were answered. Patient to return to clinic for periodic oral examination in approximately 2-3 weeks during radiation therapy. Patient to call if questions or problems arise before then.  Lenn Cal, DDS

## 2017-09-08 NOTE — Telephone Encounter (Signed)
Oncology Nurse Navigator Documentation  Spoke with pt's wife, confirmed their 0900 arrival for [facility], reviewed registration procedure.  Confirmed 0930 CT SIM.  Gayleen Orem, RN, BSN Head & Neck Oncology Nurse [place] [place] [phone]

## 2017-09-09 ENCOUNTER — Ambulatory Visit: Payer: Medicare Other | Admitting: Physical Therapy

## 2017-09-09 ENCOUNTER — Other Ambulatory Visit: Payer: Self-pay

## 2017-09-09 ENCOUNTER — Ambulatory Visit
Admission: RE | Admit: 2017-09-09 | Discharge: 2017-09-09 | Disposition: A | Discharge status: Home / Self Care | Payer: Medicare Other | Source: Ambulatory Visit | Attending: Radiation Oncology | Admitting: Radiation Oncology

## 2017-09-09 ENCOUNTER — Encounter: Payer: Self-pay | Admitting: *Deleted

## 2017-09-09 ENCOUNTER — Inpatient Hospital Stay: Payer: Medicare Other | Attending: Radiation Oncology | Admitting: Nutrition

## 2017-09-09 ENCOUNTER — Ambulatory Visit: Payer: Medicare Other | Attending: Radiation Oncology

## 2017-09-09 DIAGNOSIS — C090 Malignant neoplasm of tonsillar fossa: Secondary | ICD-10-CM | POA: Diagnosis not present

## 2017-09-09 DIAGNOSIS — R293 Abnormal posture: Secondary | ICD-10-CM | POA: Diagnosis not present

## 2017-09-09 DIAGNOSIS — R29898 Other symptoms and signs involving the musculoskeletal system: Secondary | ICD-10-CM | POA: Diagnosis not present

## 2017-09-09 DIAGNOSIS — D490 Neoplasm of unspecified behavior of digestive system: Secondary | ICD-10-CM

## 2017-09-09 DIAGNOSIS — Z9189 Other specified personal risk factors, not elsewhere classified: Secondary | ICD-10-CM | POA: Diagnosis not present

## 2017-09-09 DIAGNOSIS — R1310 Dysphagia, unspecified: Secondary | ICD-10-CM | POA: Insufficient documentation

## 2017-09-09 DIAGNOSIS — Z510 Encounter for antineoplastic radiation therapy: Secondary | ICD-10-CM | POA: Diagnosis not present

## 2017-09-09 NOTE — Therapy (Signed)
[place], [place], [address] Phone: [phone]   Fax:  [phone]  Physical Therapy Evaluation  Patient Details  Name: Tracy [name] MRN: [id number] Date of Birth: [birth year] Referring Provider: Dr. [name]   Encounter Date: 09/09/2017  PT End of Session - 09/09/17 1024    Visit Number  1    Number of Visits  1    PT Start Time  0908    PT Stop Time  0932    PT Time Calculation (min)  24 min    Activity Tolerance  Patient tolerated treatment well    Behavior During Therapy  [facility] for tasks assessed/performed       Past Medical History:  Diagnosis Date  . Diabetes mellitus ([place])   . Glaucoma   . Kidney stones 1985  . Neck mass 12/24/2016    Past Surgical History:  Procedure Laterality Date  . 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 FINE NEEDLE ASPIRATION  12/25/2016   Left neck, Level 2  . NASAL SEPTUM SURGERY     remotely  . TOE SURGERY     broken toe with bone ground down.     There were no vitals filed for this visit.   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  Has had three surgeries: left tonsillectomy 02/11/17, left neck lymphadenectomy (1/23 nodes positive) 03/14/17, and excision of further nodes 08/15/17 (1/8 positive). Will have adjuvant XRT with simulation today. Former smoker. IDDM. Occasional vertigo.    Patient Stated Goals  get info from all head & neck clinic providers    Currently in Pain?  Yes    Pain Score  2     Pain Location  Neck    Pain Orientation  Left    Pain Descriptors / Indicators  Sore + burning at tope of left shoulder    Pain Type  Surgical pain    Aggravating Factors   moving his head or lying on left side    Pain  Relieving Factors  keeping still         [facility] PT Assessment - 09/09/17 0001      Assessment   Medical Diagnosis  left tonsil cancer with neck adenopathy    Referring Provider  Dr. [name]    Onset Date/Surgical Date  02/11/17 also 03/14/17 and 08/15/17    Hand Dominance  Right    Prior Therapy  none      Precautions   Precautions  Other (comment)    Precaution Comments  cancer precautions      Restrictions   Weight Bearing Restrictions  No      Balance Screen   Has the patient fallen in the past 6 months  No    Has the patient had a decrease in activity level because of a fear of falling?   No    Is the patient reluctant to leave their home because of a fear of falling?   No      Home Environment   Living Environment  Private residence    Living Arrangements  Spouse/significant other    Type of [place]  One level      Prior Function   Level of Independence  Independent      Cognition   Overall Cognitive Status  Within Functional Limits for tasks assessed      Observation/Other Assessments   Skin Integrity  small scab at left neck, posterolateral aspect, from recent drain; well-healed incision in horizontal across left anterolateral neck; smaller incision just superior to this which is difficult to see      Coordination   Gross Motor Movements are Fluid and Coordinated  Yes      Functional Tests   Functional tests  Sit to Stand      Sit to Stand   Comments  11 times in 30 seconds, below average for age      Posture/Postural Control   Posture/Postural Control  Postural limitations    Postural Limitations  Forward head      ROM / Strength   AROM / PROM / Strength  AROM      AROM   Overall AROM Comments  neck Rt. sidebend 25% loss, ortation 10% loss bilat., extension 15% loss; shoulder AROM WFL      Palpation   Palpation comment  significantly decreased soft tissue mobility at left neck around incisions      Ambulation/Gait    Ambulation/Gait  Yes    Ambulation/Gait Assistance  7: Independent        LYMPHEDEMA/ONCOLOGY QUESTIONNAIRE - 09/09/17 1020      Type   Cancer Type  left tonsil with lymphadenopathy      Surgeries   Other Surgery Date  02/11/17 for tonsillectomy; 03/14/17 and 08/15/17 lymphadenectomies      Treatment   Active Radiation Treatment  -- to start soon; SIM on 09/09/17      Lymphedema Assessments   Lymphedema Assessments  Head and Neck      Head and Neck   4 cm superior to sternal notch around neck  39.1 cm    6 cm superior to sternal notch around neck  39 cm    8 cm superior to sternal notch around neck  39.7 cm             Objective measurements completed on examination: See above findings.              PT Education - 09/09/17 1023    Education provided  Yes    Education Details  neck ROM, posture, walking, CURE article on staying active, "Why exercise?" flyer, lymphedema and PT info    Person(s) Educated  Patient;Spouse;Child(ren)    Methods  Explanation;Handout    Comprehension  Verbalized understanding              Head and Neck Clinic Goals - 09/09/17 1135      Patient will be able to verbalize understanding of a home exercise program for cervical range of motion, posture, and walking.    Status  Achieved      Patient will be able to verbalize understanding of proper sitting and standing posture.    Status  Achieved      Patient will be able to verbalize understanding of lymphedema risk and availability of treatment for this condition.    Status  Achieved         Plan - 09/09/17 1024    Clinical Impression Statement  Cheerful, pleasant gentleman with left tonsil cancer with lymphadenopathy.  He has had tonsillectomy and two neck dissections with 1/23 nodes positive first and then 1/8  nodes positive. He will start adjuvant radiation soon.  He has a significant risk for neck lymphedema.  Neck soft tissue shows very limited  mobility now secondary to scarring; he has limited neck ROM as well, and forward head posture. He showed below average 30 second sit to stand test results.    History and Personal Factors relevant to plan of care:  IDDM, occasional vertigo    Clinical Presentation  Evolving    Clinical Presentation due to:  3 surgeries to date, and will start radiation soon    Clinical Decision Making  Moderate    Rehab Potential  Good    PT Frequency  One time visit    PT Treatment/Interventions  Patient/family education    PT Next Visit Plan  no follow-up planned at this time; he may need it should lymphedema or other problems develop with treatment    PT Home Exercise Plan  walking, neck ROM    Consulted and Agree with Plan of Care  Patient       Patient will benefit from skilled therapeutic intervention in order to improve the following deficits and impairments:  Decreased range of motion, Increased fascial restricitons, Decreased scar mobility, Postural dysfunction  Visit Diagnosis: Neoplasm of tonsil - Plan: PT plan of care cert/re-cert  Abnormal posture - Plan: PT plan of care cert/re-cert  Other symptoms and signs involving the musculoskeletal system - Plan: PT plan of care cert/re-cert  At risk for lymphedema     Problem List Patient Active Problem List   Diagnosis Date Noted  . Cancer of tonsillar fossa ([place]) 01/29/2017    [place] [place] 09/09/2017, 11:38 AM  [place], [place], [address] Phone: [phone]   Fax:  [phone]  Name: Tracy [name] MRN: [id number] Date of Birth: 1950/06/17  Serafina Royals, PT 09/09/17 11:38 AM

## 2017-09-09 NOTE — Therapy (Signed)
[facility] [address] [place], [place], [address] Phone: [phone]   Fax:  [phone]  Speech Language Pathology Evaluation  Patient Details  Name: Tracy [name] MRN: [id number] Date of Birth: 27-Nov-1950 Referring Provider: [name] MD   Encounter Date: 09/09/2017  End of Session - 09/09/17 1721    Visit Number  1    Number of Visits  4    Date for SLP Re-Evaluation  12/08/17    SLP Start Time  1042    SLP Stop Time   1112    SLP Time Calculation (min)  30 min    Activity Tolerance  Patient tolerated treatment well       Past Medical History:  Diagnosis Date  . Diabetes mellitus ([place])   . Glaucoma   . Kidney stones 1985  . Neck mass 12/24/2016    Past Surgical History:  Procedure Laterality Date  . 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 FINE NEEDLE ASPIRATION  12/25/2016   Left neck, Level 2  . NASAL SEPTUM SURGERY     remotely  . TOE SURGERY     broken toe with bone ground down.     There were no vitals filed for this visit.  Subjective Assessment - 09/09/17 1046    Subjective  When swallowing saliva pt notes his swallow seems "slower" but this does not occur with food/liquid.    Patient is accompained by:  Family member wife    Pain Score  2     Pain Location  Neck    Pain Orientation  Left    Pain Descriptors / Indicators  Sore    Pain Type  Surgical pain    Aggravating Factors   moving    Pain Relieving Factors  not moving    Multiple Pain Sites  Yes    Pain Score  3    Pain Location  Shoulder    Pain Orientation  Right    Pain Descriptors / Indicators  Burning;Sore    Pain Type  Surgical pain    Pain Onset  1 to 4 weeks ago    Pain Frequency  Constant    Aggravating Factors   nothing     Pain Relieving Factors  nothing         SLP Evaluation [facility] - 09/09/17 1046      SLP Visit Information   SLP Received On  09/09/17    Referring Provider  [name] MD     Onset Date  September 2018    Medical Diagnosis  Cancer of tonsillar fossa      General Information   HPI  Pt with lt tonsillectomy Sept 2018,October 2018 lt neck lymphadenectomy (1/23 positive). August 15, 2017 had excision of nodes in area ID'd on Jul 16, 2017 PET scan. One of eight nodes positive. Pt suspects rad tx to begin 09-24-17.       Prior Functional Status   Cognitive/Linguistic Baseline  Within functional limits      Oral Motor/Sensory Function   Labial ROM  Within Functional Limits    Labial Symmetry  Within Functional Limits    Labial Strength  Within Functional Limits    Labial Sensation  Within Functional Limits    Labial Coordination  WFL    Lingual ROM  Within Functional Limits    Lingual Symmetry  Within Functional Limits    Lingual Strength  Within Functional Limits  Lingual Coordination  WFL    Facial ROM  Within Functional Limits    Velum  Impaired left sluggish movement (slight)    Overall Oral Motor/Sensory Function  Surgical incision sites appreciated on pt's lt neck. Mod fibrotic texture, currently, moreso towards earlobe than medially. SLP notes pt's lt velum slightly sluggish.      Motor Speech   Overall Motor Speech  Appears within functional limits for tasks assessed       Pt currently tolerates soft food with a good amount of regular diet foods, pt has limited tougher or more dense foods in the last 4 weeks.  POs: Pt ate [place] sandwich and drank H2O without overt s/s aspiration. Thyroid elevation appeared adequate, and swallows appeared timely. Oral residue noted as minimal/WNL. Pt's swallow deemed WNL at this time.   Because data states the risk for dysphagia during and after radiation treatment is high due to undergoing radiation tx, SLP taught pt about the possibility of reduced/limited ability for PO intake during rad tx. SLP encouraged pt to continue swallowing POs as far into rad tx as possible, even ingesting POs and/or completing HEP shortly after  administration of pain meds.   SLP educated pt re: changes to swallowing musculature after rad tx, and why adherence to dysphagia HEP provided today and PO consumption was necessary to inhibit muscular disuse atrophy and to reduce muscle fibrosis following rad tx. Pt demonstrated understanding of these things to SLP.    SLP then developed a HEP for pt and pt was instructed how to perform exercises involving lingual, vocal, and pharyngeal strengthening. SLP performed each exercise and pt return demonstrated each exercise. SLP ensured pt performance was correct prior to moving on to next exercise. Pt was instructed to complete this program 2 times a day, until 6 months after his or her last rad tx, then x2 a week after that.                 SLP Education - 09/09/17 1720    Education provided  Yes    Education Details  HEP for swallowing, late effects head/neck radiation on swallowing    Person(s) Educated  Patient;Child(ren);Spouse    Methods  Demonstration;Explanation;Verbal cues;Handout    Comprehension  Verbalized understanding;Need further instruction;Returned demonstration;Verbal cues required       SLP Short Term Goals - 09/09/17 1723      SLP SHORT TERM GOAL #1   Title  pt will complete HEP with occasional min A     Time  1    Period  -- visits (visit #2)    Status  New      SLP SHORT TERM GOAL #2   Title  pt will tell SLP why he is completing HEP     Time  1    Period  -- visit (visit #2)       SLP Long Term Goals - 09/09/17 1724      SLP LONG TERM GOAL #1   Title  pt will complete HEP with modified independence     Time  3    Period  -- visits (4th total visit)    Status  New      SLP LONG TERM GOAL #2   Title  pt will tell SLP when HEP frequency can be reduced to x2-3/week    Time  3    Period  -- visits (4th total visit)    Status  New      SLP  LONG TERM GOAL #3   Title  pt will tell SLP 3 overt s/s of aspiration PNA with modified independence     Time  2    Period  -- visits (3rd total visit)    Status  New      SLP LONG TERM GOAL #4   Title  pt will tell SLP how a food journal can assist with return to pt's safest diet    Time  2    Period  -- visits (3rd total visit)    Status  New       Plan - 09/09/17 1722    Clinical Impression Statement  Pt with oropharyngeal swallowing essentially WNL, however the probability of swallowing difficulty increases dramatically with the initiation of radiation therapy. Pt will need to be followed by SLP for regular assessment of accurate HEP completion as well as for safety with POs both during and following treatment/s.    Speech Therapy Frequency  -- approx once every four weeks    Duration  -- 4 total visits (eval + 3 therapy visits)    Treatment/Interventions  Aspiration precaution training;Pharyngeal strengthening exercises;Diet toleration management by SLP;Trials of upgraded texture/liquids;Internal/external aids;Patient/family education;Compensatory strategies;SLP instruction and feedback;Cueing hierarchy;Environmental controls    Potential to Achieve Goals  Good    SLP Home Exercise Plan  provided today    Consulted and Agree with Plan of Care  Patient       Patient will benefit from skilled therapeutic intervention in order to improve the following deficits and impairments:   Dysphagia, unspecified type    Problem List Patient Active Problem List   Diagnosis Date Noted  . Cancer of tonsillar fossa ([place]) 01/29/2017    [facility], [place]  09/09/2017, 5:27 PM  [facility] [address] [place], [place], [address] Phone: [phone]   Fax:  [phone]  Name: Tracy [name] MRN: [id number] Date of Birth: 1951/04/23

## 2017-09-09 NOTE — Progress Notes (Signed)
Head and Neck Cancer Simulation, IMRT treatment planning note   Outpatient  Diagnosis:    ICD-10-CM   1. Cancer of tonsillar fossa (HCC) C09.0     The patient was taken to the CT simulator and laid in the supine position on the table. An Aquaplast head and shoulder mask was custom fitted to the patient's anatomy. High-resolution CT axial imaging was obtained of the head and neck with contrast. I verified that the quality of the imaging is good for treatment planning. 1 Medically Necessary Treatment Device was fabricated and supervised by me: Aquaplast mask.   Treatment planning note I plan to treat the patient with IMRT. I plan to treat the patient's tumor bed (tonsillar region) and bilateral neck nodes. I plan to treat to a total dose of 60 Gray in 30  fractions. Dose calculation was ordered from dosimetry.  IMRT planning Note  IMRT is medically necessary and an important modality to deliver adequate dose to the patient's at risk tissues while sparing the patient's normal structures, including the: esophagus, parotid tissue, mandible, brain stem, spinal cord, oral cavity, brachial plexus.  This justifies the use of IMRT in the patient's treatment.    -----------------------------------  [name], MD

## 2017-09-09 NOTE — Progress Notes (Signed)
Patient was seen during head and neck clinic.  67 year old male diagnosed with recurrent tonsil cancer to receive adjuvant radiation therapy.  Past medical history includes tobacco, alcohol, kidney stones, and diabetes.  Medications include insulin.  Labs include increased glucose at 505 on April 21, 2017.  Height: 5 feet 9 inches. Weight: 196.8 pounds.  Patient reports he has recently gained weight because he knows he will lose it during treatment. Patient denies nutrition impact symptoms. There is no plan for feeding tube.  Nutrition diagnosis:  Food and nutrition related knowledge deficit related to recurrent tonsil cancer as evidenced by no prior need for nutrition related information.  Intervention: Educated patient to consume smaller more frequent meals and snacks to minimize weight loss throughout treatment Educated patient on the importance of protein for healing. Provided a fact sheet. Questions were answered.  Teach back method used.  Contact information provided.  Monitoring evaluation goals: Patient will tolerate adequate calories and protein to minimize weight loss throughout treatment.  Next visit: To be scheduled weekly as needed.  **Disclaimer: This note was dictated with voice recognition software. Similar sounding words can inadvertently be transcribed and this note may contain transcription errors which may not have been corrected upon publication of note.**

## 2017-09-09 NOTE — Patient Instructions (Signed)
SWALLOWING EXERCISES Do these 6 of the 7 days per week until 6 months after your last day of radiation, then 2 times per week afterwards  1. Effortful Swallows - Press your tongue against the roof of your mouth for 3 seconds, then squeeze the muscles in your neck while you swallow your saliva or a sip of water - Repeat 20 times, 2-3 times a day, and use whenever you eat or drink  2. Masako Swallow - swallow with your tongue sticking out - Stick tongue out past your teeth and gently bite tongue with your teeth - Swallow, while holding your tongue with your teeth - Repeat 20 times, 2-3 times a day *use a wet spoon if your mouth gets dry*  3. Pitch Raise - Repeat "he", once per second in as high of a pitch as you can - Repeat 20 times, 2-3 times a day  4. Shaker Exercise - head lift - Lie flat on your back in your bed or on a couch without pillows - Raise your head and look at your feet - KEEP YOUR SHOULDERS DOWN - HOLD FOR 45-60 SECONDS, then lower your head back down - Repeat 3 times, 2-3 times a day  5. Mendelsohn Maneuver - "half swallow" exercise - Start to swallow, and keep your Adam's apple up by squeezing hard with the            muscles of the throat - Hold the squeeze for 5-7 seconds and then relax - Repeat 20 times, 2-3 times a day *use a wet spoon if your mouth gets dry*  6. Breath Hold - Say "HUH!" loudly, then hold your breath for 3 seconds at your voice box - Repeat 20 times, 2-3 times a day  7. Chin pushback - Open your mouth  - Place your fist UNDER your chin near your neck, and push back with your fist for 5 seconds - Repeat 10 times, 2-3 times a day    

## 2017-09-11 NOTE — Progress Notes (Signed)
Oncology Nurse Navigator Documentation  Met with Tracy [name] upon his arrival for [facility].  He was accompanied by his wife and dtr.  Provided verbal and written overview of [place], the clinicians who will be seeing him, encouraged him to ask questions during his time with them.  He was seen by Nutrition, SLP, PT, SW and [place].  Spoke with him at end of [facility], addressed questions. I encouraged him to call me with needs/concerns.  Gayleen Orem, RN, BSN Head & Neck Oncology [place] at [place] [phone]

## 2017-09-11 NOTE — Progress Notes (Signed)
Oncology Nurse Navigator Documentation  To provide support, encouragement and care continuity, met with Tracy [name] during his CT SIM. He was accompanied by his wife and dtr.   He tolerated procedure without difficulty, denied questions/concerns.   I toured them to [facility] treatment area, explained procedures for lobby registration, arrival to Radiation Waiting, arrival to tmt area and preparation for tmt.  They voiced understanding.   I encouraged them to call me prior to 4/24 [place].  Gayleen Orem, RN, BSN Head & Neck Oncology Nurse [place] at [place] [phone]

## 2017-09-16 MED FILL — FLUORISHIELD 1.1% GEL: 1.1 % | 30 days supply | Qty: 114 | Fill #0

## 2017-09-22 DIAGNOSIS — Z510 Encounter for antineoplastic radiation therapy: Secondary | ICD-10-CM | POA: Diagnosis not present

## 2017-09-22 DIAGNOSIS — C090 Malignant neoplasm of tonsillar fossa: Secondary | ICD-10-CM | POA: Diagnosis not present

## 2017-09-24 ENCOUNTER — Other Ambulatory Visit: Payer: Self-pay | Admitting: Radiation Oncology

## 2017-09-24 ENCOUNTER — Ambulatory Visit
Admission: RE | Admit: 2017-09-24 | Discharge: 2017-09-24 | Disposition: A | Discharge status: Home / Self Care | Payer: Medicare Other | Source: Ambulatory Visit | Attending: Radiation Oncology | Admitting: Radiation Oncology

## 2017-09-24 DIAGNOSIS — Z510 Encounter for antineoplastic radiation therapy: Secondary | ICD-10-CM | POA: Diagnosis not present

## 2017-09-24 DIAGNOSIS — C090 Malignant neoplasm of tonsillar fossa: Secondary | ICD-10-CM | POA: Diagnosis not present

## 2017-09-24 DIAGNOSIS — C8591 Non-Hodgkin lymphoma, unspecified, lymph nodes of head, face, and neck: Secondary | ICD-10-CM

## 2017-09-24 MED ORDER — LORAZEPAM 0.5 MG PO TABS: ORAL_TABLET | ORAL | 0 refills | Status: DC

## 2017-09-25 ENCOUNTER — Ambulatory Visit
Admission: RE | Admit: 2017-09-25 | Discharge: 2017-09-25 | Disposition: A | Discharge status: Home / Self Care | Payer: Medicare Other | Source: Ambulatory Visit | Attending: Radiation Oncology | Admitting: Radiation Oncology

## 2017-09-25 ENCOUNTER — Inpatient Hospital Stay: Payer: Medicare Other | Admitting: Nutrition

## 2017-09-25 DIAGNOSIS — C090 Malignant neoplasm of tonsillar fossa: Secondary | ICD-10-CM | POA: Diagnosis not present

## 2017-09-25 DIAGNOSIS — Z510 Encounter for antineoplastic radiation therapy: Secondary | ICD-10-CM | POA: Diagnosis not present

## 2017-09-25 MED ORDER — SONAFINE EX EMUL
1.0000 "application " | Freq: Once | CUTANEOUS | Status: AC
  Administered 2017-09-25: 1 via TOPICAL

## 2017-09-25 NOTE — Progress Notes (Signed)

## 2017-09-25 NOTE — Progress Notes (Signed)
Nutrition follow-up completed with patient receiving treatment for recurrent tonsil cancer. Weight was documented as 194.6 pounds Patient expresses concern regarding oral nutrition supplements that have a lot of carbohydrates. Currently denying nutrition impact symptoms.  Nutrition diagnosis: Food and nutrition related knowledge deficit continues.  Intervention: Educated patient to continue small frequent meals and snacks. Educated patient to try no added sugar Carnation breakfast essentials for additional calories and protein. Provided recipes for low carbohydrate smoothies. Questions were answered teach back method used.  Monitoring, evaluation, goals: Patient will tolerate adequate calories and protein to minimize weight loss.  Next visit: Thursday, May 2.  **Disclaimer: This note was dictated with voice recognition software. Similar sounding words can inadvertently be transcribed and this note may contain transcription errors which may not have been corrected upon publication of note.**

## 2017-09-26 ENCOUNTER — Ambulatory Visit
Admission: RE | Admit: 2017-09-26 | Discharge: 2017-09-26 | Disposition: A | Discharge status: Home / Self Care | Payer: Medicare Other | Source: Ambulatory Visit | Attending: Radiation Oncology | Admitting: Radiation Oncology

## 2017-09-26 DIAGNOSIS — Z510 Encounter for antineoplastic radiation therapy: Secondary | ICD-10-CM | POA: Diagnosis not present

## 2017-09-26 DIAGNOSIS — C090 Malignant neoplasm of tonsillar fossa: Secondary | ICD-10-CM | POA: Diagnosis not present

## 2017-09-29 ENCOUNTER — Ambulatory Visit
Admission: RE | Admit: 2017-09-29 | Discharge: 2017-09-29 | Disposition: A | Discharge status: Home / Self Care | Payer: Medicare Other | Source: Ambulatory Visit | Attending: Radiation Oncology | Admitting: Radiation Oncology

## 2017-09-29 ENCOUNTER — Other Ambulatory Visit: Payer: Self-pay | Admitting: Radiation Oncology

## 2017-09-29 DIAGNOSIS — Z510 Encounter for antineoplastic radiation therapy: Secondary | ICD-10-CM | POA: Diagnosis not present

## 2017-09-29 DIAGNOSIS — C099 Malignant neoplasm of tonsil, unspecified: Secondary | ICD-10-CM

## 2017-09-29 DIAGNOSIS — C090 Malignant neoplasm of tonsillar fossa: Secondary | ICD-10-CM | POA: Diagnosis not present

## 2017-09-29 MED ORDER — LIDOCAINE VISCOUS 2 % MT SOLN: OROMUCOSAL | 5 refills | Status: DC

## 2017-09-30 ENCOUNTER — Ambulatory Visit
Admission: RE | Admit: 2017-09-30 | Discharge: 2017-09-30 | Disposition: A | Discharge status: Home / Self Care | Payer: Medicare Other | Source: Ambulatory Visit | Attending: Radiation Oncology | Admitting: Radiation Oncology

## 2017-09-30 DIAGNOSIS — Z510 Encounter for antineoplastic radiation therapy: Secondary | ICD-10-CM | POA: Diagnosis not present

## 2017-09-30 DIAGNOSIS — C090 Malignant neoplasm of tonsillar fossa: Secondary | ICD-10-CM | POA: Diagnosis not present

## 2017-10-01 ENCOUNTER — Ambulatory Visit
Admission: RE | Admit: 2017-10-01 | Discharge: 2017-10-01 | Disposition: A | Discharge status: Home / Self Care | Payer: Medicare Other | Source: Ambulatory Visit | Attending: Radiation Oncology | Admitting: Radiation Oncology

## 2017-10-01 DIAGNOSIS — Z510 Encounter for antineoplastic radiation therapy: Secondary | ICD-10-CM | POA: Insufficient documentation

## 2017-10-01 DIAGNOSIS — C090 Malignant neoplasm of tonsillar fossa: Secondary | ICD-10-CM | POA: Diagnosis not present

## 2017-10-02 ENCOUNTER — Ambulatory Visit
Admission: RE | Admit: 2017-10-02 | Discharge: 2017-10-02 | Disposition: A | Discharge status: Home / Self Care | Payer: Medicare Other | Source: Ambulatory Visit | Attending: Radiation Oncology | Admitting: Radiation Oncology

## 2017-10-02 ENCOUNTER — Inpatient Hospital Stay: Payer: Medicare Other | Attending: Radiation Oncology | Admitting: Nutrition

## 2017-10-02 DIAGNOSIS — C770 Secondary and unspecified malignant neoplasm of lymph nodes of head, face and neck: Secondary | ICD-10-CM | POA: Insufficient documentation

## 2017-10-02 DIAGNOSIS — C090 Malignant neoplasm of tonsillar fossa: Secondary | ICD-10-CM | POA: Diagnosis not present

## 2017-10-02 DIAGNOSIS — C099 Malignant neoplasm of tonsil, unspecified: Secondary | ICD-10-CM | POA: Insufficient documentation

## 2017-10-02 DIAGNOSIS — R8581 Anal high risk human papillomavirus (HPV) DNA test positive: Secondary | ICD-10-CM | POA: Insufficient documentation

## 2017-10-02 DIAGNOSIS — E119 Type 2 diabetes mellitus without complications: Secondary | ICD-10-CM | POA: Insufficient documentation

## 2017-10-02 DIAGNOSIS — Z8042 Family history of malignant neoplasm of prostate: Secondary | ICD-10-CM | POA: Insufficient documentation

## 2017-10-02 DIAGNOSIS — Z87891 Personal history of nicotine dependence: Secondary | ICD-10-CM | POA: Insufficient documentation

## 2017-10-02 DIAGNOSIS — Z510 Encounter for antineoplastic radiation therapy: Secondary | ICD-10-CM | POA: Diagnosis not present

## 2017-10-02 NOTE — Progress Notes (Signed)
Nutrition follow-up completed with patient receiving treatment for recurrent tonsil cancer. Weight documented as 196.8 pounds. Patient reports he continues to eat well and is able to chew and swallow normally at this time. He is losing some taste and reports dry mouth. He has not tried oral nutrition supplements.  Nutrition diagnosis: Food and nutrition related knowledge deficit improved.  Intervention: Patient educated to continue small amounts of food more often to promote weight stabilization. Educated patient on strategies to improve dry mouth. Also provided patient with strategies for taste alterations. Questions were answered.  Teach back method used.  Monitoring, evaluation, goals: Patient will tolerate adequate calories and protein to minimize weight loss throughout treatment.  Next visit: Wednesday, May 8 after radiation.  **Disclaimer: This note was dictated with voice recognition software. Similar sounding words can inadvertently be transcribed and this note may contain transcription errors which may not have been corrected upon publication of note.**

## 2017-10-03 ENCOUNTER — Ambulatory Visit
Admission: RE | Admit: 2017-10-03 | Discharge: 2017-10-03 | Disposition: A | Discharge status: Home / Self Care | Payer: Medicare Other | Source: Ambulatory Visit | Attending: Radiation Oncology | Admitting: Radiation Oncology

## 2017-10-03 DIAGNOSIS — C090 Malignant neoplasm of tonsillar fossa: Secondary | ICD-10-CM | POA: Diagnosis not present

## 2017-10-03 DIAGNOSIS — Z510 Encounter for antineoplastic radiation therapy: Secondary | ICD-10-CM | POA: Diagnosis not present

## 2017-10-06 ENCOUNTER — Ambulatory Visit
Admission: RE | Admit: 2017-10-06 | Discharge: 2017-10-06 | Disposition: A | Discharge status: Home / Self Care | Payer: Medicare Other | Source: Ambulatory Visit | Attending: Radiation Oncology | Admitting: Radiation Oncology

## 2017-10-06 DIAGNOSIS — Z510 Encounter for antineoplastic radiation therapy: Secondary | ICD-10-CM | POA: Diagnosis not present

## 2017-10-06 DIAGNOSIS — C090 Malignant neoplasm of tonsillar fossa: Secondary | ICD-10-CM | POA: Diagnosis not present

## 2017-10-07 ENCOUNTER — Ambulatory Visit
Admission: RE | Admit: 2017-10-07 | Discharge: 2017-10-07 | Disposition: A | Discharge status: Home / Self Care | Payer: Medicare Other | Source: Ambulatory Visit | Attending: Radiation Oncology | Admitting: Radiation Oncology

## 2017-10-07 DIAGNOSIS — Z510 Encounter for antineoplastic radiation therapy: Secondary | ICD-10-CM | POA: Diagnosis not present

## 2017-10-07 DIAGNOSIS — C090 Malignant neoplasm of tonsillar fossa: Secondary | ICD-10-CM | POA: Diagnosis not present

## 2017-10-08 ENCOUNTER — Ambulatory Visit
Admission: RE | Admit: 2017-10-08 | Discharge: 2017-10-08 | Disposition: A | Discharge status: Home / Self Care | Payer: Medicare Other | Source: Ambulatory Visit | Attending: Radiation Oncology | Admitting: Radiation Oncology

## 2017-10-08 ENCOUNTER — Inpatient Hospital Stay: Payer: Medicare Other | Admitting: Nutrition

## 2017-10-08 DIAGNOSIS — Z510 Encounter for antineoplastic radiation therapy: Secondary | ICD-10-CM | POA: Diagnosis not present

## 2017-10-08 DIAGNOSIS — C090 Malignant neoplasm of tonsillar fossa: Secondary | ICD-10-CM | POA: Diagnosis not present

## 2017-10-09 ENCOUNTER — Ambulatory Visit
Admission: RE | Admit: 2017-10-09 | Discharge: 2017-10-09 | Disposition: A | Discharge status: Home / Self Care | Payer: Medicare Other | Source: Ambulatory Visit | Attending: Radiation Oncology | Admitting: Radiation Oncology

## 2017-10-09 DIAGNOSIS — C090 Malignant neoplasm of tonsillar fossa: Secondary | ICD-10-CM | POA: Diagnosis not present

## 2017-10-09 DIAGNOSIS — Z510 Encounter for antineoplastic radiation therapy: Secondary | ICD-10-CM | POA: Diagnosis not present

## 2017-10-10 ENCOUNTER — Ambulatory Visit
Admission: RE | Admit: 2017-10-10 | Discharge: 2017-10-10 | Disposition: A | Discharge status: Home / Self Care | Payer: Medicare Other | Source: Ambulatory Visit | Attending: Radiation Oncology | Admitting: Radiation Oncology

## 2017-10-10 DIAGNOSIS — Z510 Encounter for antineoplastic radiation therapy: Secondary | ICD-10-CM | POA: Diagnosis not present

## 2017-10-10 DIAGNOSIS — C090 Malignant neoplasm of tonsillar fossa: Secondary | ICD-10-CM | POA: Diagnosis not present

## 2017-10-13 ENCOUNTER — Ambulatory Visit
Admission: RE | Admit: 2017-10-13 | Discharge: 2017-10-13 | Disposition: A | Discharge status: Home / Self Care | Payer: Medicare Other | Source: Ambulatory Visit | Attending: Radiation Oncology | Admitting: Radiation Oncology

## 2017-10-13 DIAGNOSIS — C090 Malignant neoplasm of tonsillar fossa: Secondary | ICD-10-CM | POA: Diagnosis not present

## 2017-10-13 DIAGNOSIS — Z510 Encounter for antineoplastic radiation therapy: Secondary | ICD-10-CM | POA: Diagnosis not present

## 2017-10-14 ENCOUNTER — Ambulatory Visit
Admission: RE | Admit: 2017-10-14 | Discharge: 2017-10-14 | Disposition: A | Discharge status: Home / Self Care | Payer: Medicare Other | Source: Ambulatory Visit | Attending: Radiation Oncology | Admitting: Radiation Oncology

## 2017-10-14 DIAGNOSIS — C090 Malignant neoplasm of tonsillar fossa: Secondary | ICD-10-CM | POA: Diagnosis not present

## 2017-10-14 DIAGNOSIS — Z510 Encounter for antineoplastic radiation therapy: Secondary | ICD-10-CM | POA: Diagnosis not present

## 2017-10-15 ENCOUNTER — Ambulatory Visit
Admission: RE | Admit: 2017-10-15 | Discharge: 2017-10-15 | Disposition: A | Discharge status: Home / Self Care | Payer: Medicare Other | Source: Ambulatory Visit | Attending: Radiation Oncology | Admitting: Radiation Oncology

## 2017-10-15 DIAGNOSIS — C090 Malignant neoplasm of tonsillar fossa: Secondary | ICD-10-CM | POA: Diagnosis not present

## 2017-10-15 DIAGNOSIS — Z510 Encounter for antineoplastic radiation therapy: Secondary | ICD-10-CM | POA: Diagnosis not present

## 2017-10-16 ENCOUNTER — Ambulatory Visit
Admission: RE | Admit: 2017-10-16 | Discharge: 2017-10-16 | Disposition: A | Discharge status: Home / Self Care | Payer: Medicare Other | Source: Ambulatory Visit | Attending: Radiation Oncology | Admitting: Radiation Oncology

## 2017-10-16 ENCOUNTER — Inpatient Hospital Stay: Payer: Medicare Other | Admitting: Nutrition

## 2017-10-16 DIAGNOSIS — C090 Malignant neoplasm of tonsillar fossa: Secondary | ICD-10-CM | POA: Diagnosis not present

## 2017-10-16 DIAGNOSIS — Z510 Encounter for antineoplastic radiation therapy: Secondary | ICD-10-CM | POA: Diagnosis not present

## 2017-10-16 NOTE — Progress Notes (Signed)
Nutrition follow-up completed with patient after radiation therapy for tongue cancer. Weight decreased was documented as 190.8 pounds today down from 196.8 pounds May 2. Patient reports food tastes awful. His mouth is very dry. The only food he tolerates are fried eggs.  Wife states she probably prepares 6 daily for him. He is drinking Ensure plus 3 times daily.  Nutrition diagnosis: Food and nutrition related knowledge deficit continues.   Estimated nutrition needs: 2300-2500 cal, 110-120 g protein, 2.5 L fluid.  Intervention: Recommended patient increase Ensure Plus or Ensure Enlive to 5-1/2 bottles daily along with 6 eggs to provide approximately 2345 cal, 114 g protein. Provided samples of protein powder. Provided recipes to make shakes that may taste better to the patient. Stressed importance of patient consuming liquid nutrition supplements for weight maintenance. Briefly discussed feeding tube.  Monitoring, evaluation, goals: Patient will tolerate oral nutrition supplements to meet greater than 90% estimated nutrition needs.  Next visit: Tuesday, May 28 after radiation therapy.  **Disclaimer: This note was dictated with voice recognition software. Similar sounding words can inadvertently be transcribed and this note may contain transcription errors which may not have been corrected upon publication of note.**

## 2017-10-16 NOTE — Progress Notes (Signed)
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 male who has been referred to [place] by Dr. [name], his ENT, for evaluation and management of Squamous Cell Carcinoma of the head and Neck. The patient's last visit with [place] was on 04/21/17. He is accompanied today by his wife and daughter. The pt reports that he is doing well overall.   Of note since the patient's last visit, pt has had PET/CT completed on 07/16/17 with results revealing Hypermetabolic left level III cervical lymph node consistent with metastatic disease.  2.  Additional nonhypermetabolic subcentimeter left level V cervical lymph nodes are also noted. They are of questionable significance  3.  Focal area of increased uptake in the posterior left liver, of questionable significance. Recommend MRI of the liver for further evaluation. 4.  Apparent bladder wall thickening which may in part be due to underdistention. Consider urinalysis and urology consult if clinically indicated.  The pt reports seeing a couple spots on the side of his face related to radiation, and he will be completing 30 fractions with Dr Isidore Moos.for residual/recurrent disease after surgery.  He notes that he hasn't had any pain related to the IMRT, but notes thick saliva and occasionally dry mouth. He maintains rehabiliation follow up to prevent dysphagia. He also notes that he consumes several boost/ensure each day and maintains PO  consumption without trouble. He uses salt and baking soda prophylactically.   He notes that he is on 28 units insulin twice a day and his fasting glucose tests have been well controlled. He continues to be followed by his PCP. BS nearly 300 today.  Lab results today (10/20/17) of CBC, CMP, and Reticulocytes is as follows: all values are WNL except for Lymphs Abs at 0.5k, Sodium at 135, CO2 at 30, Glucose at 292.  On review of systems, pt reports stable weight, radiation-related skin change, and denies constipation, diarrhea, abdominal pains, throat pain, back pain, shortness of breath, chest pain, leg swelling, and any other symptoms.   MEDICAL HISTORY:  Past Medical History:  Diagnosis Date  . Diabetes mellitus ([place])   . Glaucoma   . Kidney stones 1985  . Neck mass 12/24/2016    SURGICAL HISTORY: Past Surgical History:  Procedure Laterality Date  . 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 FINE NEEDLE ASPIRATION  12/25/2016   Left neck, Level 2  . NASAL SEPTUM SURGERY     remotely  . TOE SURGERY     broken toe with bone ground down.     SOCIAL HISTORY: Social History   Socioeconomic History  . Marital status: Married    Spouse name: Not on file  . Number of children: Not on file  . Years of education: Not on file  . Highest education level: Not on file  Occupational History  . Not on file  Social Needs  . Financial resource strain: Not on file  . Food insecurity:    Worry: Not on file  Inability: Not on file  . Transportation needs:    Medical: Not on file    Non-medical: Not on file  Tobacco Use  . Smoking status: Former Smoker    Packs/day: 1.00    Years: 10.00    Pack years: 10.00    Types: Cigarettes    Last attempt to quit: 06/03/1985    Years since quitting: 32.3  . Smokeless tobacco: Former Systems developer    Types: [place] date: 06/03/1985  Substance and Sexual Activity  . Alcohol use: No  . Drug use: No  . Sexual activity: Not on file  Lifestyle  .  Physical activity:    Days per week: Not on file    Minutes per session: Not on file  . Stress: Not on file  Relationships  . Social connections:    Talks on phone: Not on file    Gets together: Not on file    Attends religious service: Not on file    Active member of club or organization: Not on file    Attends meetings of clubs or organizations: Not on file    Relationship status: Not on file  . Intimate partner violence:    Fear of current or ex partner: Not on file    Emotionally abused: Not on file    Physically abused: Not on file    Forced sexual activity: Not on file  Other Topics Concern  . Not on file  Social History Narrative  . Not on file    FAMILY HISTORY: His mother had mucosal melanoma and father had prostate cancer. His sister had stage 4 pancreatic cancer.  His mother and father had DM.  His maternal grandfather had leukemia.  His maternal uncle had brain cancer.   ALLERGIES:  is allergic to bismuth subsalicylate.  MEDICATIONS:  Current Outpatient Medications  Medication Sig Dispense Refill  . acetaminophen (TYLENOL) 500 MG tablet Take by mouth.    . insulin NPH-regular Human (NOVOLIN 70/30) (70-30) 100 UNIT/ML injection Inject 32 units subcutaneously every morning and inject 30 units every evening    . lidocaine (XYLOCAINE) 2 % solution Patient: Mix 1part 2% viscous lidocaine, 1part H20. Swish & swallow 34mL of diluted mixture, 33min before meals and at bedtime, up to QID 100 mL 5  . LORazepam (ATIVAN) 0.5 MG tablet Take 1 tablet as needed for anxiety, 30 minutes before treatment. 15 tablet 0  . sodium fluoride (FLUORISHIELD) 1.1 % GEL dental gel Instill one drop of gel per tooth space of fluoride tray. Place over teeth for 5 minutes. Remove. Spit out excess. Repeat nightly. 120 mL prn  . timolol (TIMOPTIC) 0.5 % ophthalmic solution      No current facility-administered medications for this visit.     REVIEW OF SYSTEMS:   A 10+ POINT REVIEW OF SYSTEMS  WAS OBTAINED including neurology, dermatology, psychiatry, cardiac, respiratory, lymph, extremities, GI, GU, Musculoskeletal, constitutional, breasts, reproductive, HEENT.  All pertinent positives are noted in the HPI.  All others are negative.   PHYSICAL EXAMINATION: ECOG PERFORMANCE STATUS: 1 - Symptomatic but completely ambulatory  .VS reviewed in [place], in no acute distress and comfortable SKIN: no acute rashes, no significant lesions EYES: conjunctiva are pink and non-injected, sclera anicteric OROPHARYNX: MMM, no exudates, no oropharyngeal erythema or ulceration NECK: supple, no JVD, healing surgical scar on neck LYMPH:  no palpable lymphadenopathy in the cervical, axillary or inguinal regions LUNGS: clear to auscultation b/l with normal respiratory effort HEART: regular  rate & rhythm ABDOMEN:  normoactive bowel sounds , non tender, not distended. Extremity: no pedal edema PSYCH: alert & oriented x 3 with fluent speech NEURO: no focal motor/sensory deficits  LABORATORY DATA:  I have reviewed the data as listed  . CBC Latest Ref Rng & Units 10/20/2017 04/21/2017 02/19/2017  WBC 4.0 - 10.3 K/uL 5.5 5.1 5.9  Hemoglobin 13.0 - 17.1 g/dL 15.6 15.0 16.4  Hematocrit 38.4 - 49.9 % 45.3 44.0 46.6  Platelets 140 - 400 K/uL 218 221 241    . CMP Latest Ref Rng & Units 10/20/2017 04/21/2017 02/19/2017  Glucose 70 - 140 mg/dL 292(H) 505(H) 126  BUN 7 - 26 mg/dL 19 17.4 18.3  Creatinine 0.70 - 1.30 mg/dL 1.27 1.4(H) 1.1  Sodium 136 - 145 mmol/L 135(L) 132(L) 140  Potassium 3.5 - 5.1 mmol/L 4.6 4.6 4.3  Chloride 98 - 109 mmol/L 100 - -  CO2 22 - 29 mmol/L 30(H) 25 25  Calcium 8.4 - 10.4 mg/dL 9.5 9.3 9.8  Total Protein 6.4 - 8.3 g/dL 7.5 7.1 7.8  Total Bilirubin 0.2 - 1.2 mg/dL 0.2 0.39 0.39  Alkaline Phos 40 - 150 U/L 87 83 77  AST 5 - 34 U/L 19 24 28   ALT 0 - 55 U/L 28 41 32     PATHOLOGY   Diagnosis 12/25/16   CONSULT SLIDE, LEFT NECK LEVEL 2 MALIGNANT CELLS  PRESENT, CONSISTENT WITH SQUAMOUS CELL CARCINOMA. 04/16/2017  03/17/2017  02/20/2017   Surgical Pathology Tissue ExamResulted: 08/19/2017 4:44 PM [facility] Specimen Collected on  Lymph Node 08/15/2017 9:02 PM  Result Narrative    ACCESSION [id number] RECEIVED: 08/15/2017 ORDERING PHYSICIAN:CHRISTOPHER A SULLIVAN , MD PATIENT NAME:[facility], Kiah STARLEY SURGICAL PATHOLOGY REPORT  FINAL PATHOLOGIC DIAGNOSIS MICROSCOPIC EXAMINATION AND DIAGNOSIS   "LEFT LEVEL 3&5 LYMPH NODE", EXCISON: Metastatic squamous cell carcinoma involving one of eight lymph nodes (1/8).    I have personally reviewed the slides and/or other related materials referenced, and have edited the report as part of my pathologic assessment and final interpretation.  Electronically Signed Out By: Sherlon Handing , MD 08/19/2017 16:44:05    RADIOGRAPHIC STUDIES: I have personally reviewed the radiological images as listed and agreed with the findings in the report.  PET/CT 07/16/2017 PET HEAD AND NECK CANCER (W/LOW DOSE CT)07/16/2017 [facility] Result Impression   1.Hypermetabolic left level III cervical lymph node consistent with metastatic disease.  2.Additional nonhypermetabolic subcentimeter left level V cervical lymph nodes are also noted. They are of questionable significance  3.Focal area of increased uptake in the posterior left liver, of questionable significance. Recommend MRI of the liver for further evaluation. 4.Apparent bladder wall thickening which may in part be due to underdistention. Consider urinalysis and urology consult if clinically indicated. 5.Ancillary CT findings as above.    PET 01/14/17 IMPRESSION: 1. Focal area of FDG uptake in the left lateral pharynx/upper left tonsillar region likely representing the primary neoplasm. 2. Metastatic hypermetabolic left level 2 neck nodes. No other FDG positive  nodal stations or contralateral adenopathy. 3. No findings for metastatic disease.   CT Soft Tissue W Contrast 01/03/17 IMPRESSION: 1. Abnormally enlarged, solid left level 2 lymph node up to 4.3 cm corresponds to the palpable area of concern. Other left level 2 and level 3 nodes are normal by size criteria but asymmetrically increased. No contralateral lymphadenopathy. No primary tumor identified. 2. Burtis Junes this is metastatic nodal disease from an occult pharyngeal/laryngeal primary. Less likely differential  considerations are lymphoproliferative disorder, lymphoma, or metastatic disease from a regional skin cancer or a distant primary. 3. Tiny right apical lung nodules appear postinflammatory.   ASSESSMENT & PLAN:   DEMONIE KASSA is a 67 y.o. 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 Only 10pk-yr h/o smoking and quit in 1982 patient is s/p TORS surgery to confirm that the left tonsils/BOT area is the primary lesion as suggested by PET/CT . -he has subsequently had radical lymph node dissection of his neck which shows 1 out of 21 lymph nodes with metastatic disease 5.1 cm without ENE. -Based on pathologic staging and input from [facility] tumor board patient has not been recommended any adjuvant radiation or chemoradiation. Plan ---patient had rpt PET/CT on 07/16/2017 which showed  hypermetabolic left level III cervical lymph node consistent with metastatic disease. Additional nonhypermetabolic subcentimeter left level V cervical lymph nodes are also noted. They are of questionable significance. Focal area of increased uptake in the posterior left liver, of questionable significance/ - on 08/15/2017 by Dr Conley Canal yielded metastatic squamous cell carcinoma in 1 out of 8 lymph nodes. -patient was recommended adjuvant IMRT and has started this  recently with Dr Isidore Moos. -Discussed pt labwork today, 10/20/17; blood counts and chemistries are stable.  -Asked pt to let me know if throat becomes painful, we would start with magic mouthwash.  -Offered a referral to genetic counseling given the patient's family hx, the pt and his wife would like to have this after treatment.  -Continue IMRT with Dr Isidore Moos -Will see pt back in one month unless new symptoms develop which would need symptom management.  #3 Diabetes Plan -recommended drinking atleast 60OZ of water daily -close f/u with PCP to optimize DM2 management. Will likely need something more than metformin to control his blood sugars.   RTC with Dr Irene Limbo in 4 weeks with labs    All of the patients questions were answered with apparent satisfaction. The patient knows to call the clinic with any problems, questions or concerns.  . The total time spent in the appointment was 25 minutes and more than 50% was on counseling and direct patient cares.     Sullivan Lone MD MS AAHIVMS [facility] Hematology/Oncology Physician [facility]  (Office):       [phone] (Work cell):  [phone] (Fax):           [phone]  10/16/2017 3:49 PM  This document serves as a record of services personally performed by Sullivan Lone, MD. It was created on his behalf by Baldwin [place], a trained medical scribe. The creation of this record is based on the scribe's personal observations and the provider's statements to them.   .I have reviewed the above documentation for accuracy and completeness, and I agree with the above. Brunetta Genera MD MS

## 2017-10-17 ENCOUNTER — Ambulatory Visit
Admission: RE | Admit: 2017-10-17 | Discharge: 2017-10-17 | Disposition: A | Discharge status: Home / Self Care | Payer: Medicare Other | Source: Ambulatory Visit | Attending: Radiation Oncology | Admitting: Radiation Oncology

## 2017-10-17 DIAGNOSIS — Z510 Encounter for antineoplastic radiation therapy: Secondary | ICD-10-CM | POA: Diagnosis not present

## 2017-10-17 DIAGNOSIS — C090 Malignant neoplasm of tonsillar fossa: Secondary | ICD-10-CM | POA: Diagnosis not present

## 2017-10-20 ENCOUNTER — Telehealth: Payer: Self-pay | Admitting: Hematology

## 2017-10-20 ENCOUNTER — Ambulatory Visit: Payer: Medicare Other | Attending: Radiation Oncology

## 2017-10-20 ENCOUNTER — Ambulatory Visit
Admission: RE | Admit: 2017-10-20 | Discharge: 2017-10-20 | Disposition: A | Discharge status: Home / Self Care | Payer: Medicare Other | Source: Ambulatory Visit | Attending: Radiation Oncology | Admitting: Radiation Oncology

## 2017-10-20 ENCOUNTER — Other Ambulatory Visit: Payer: Self-pay | Admitting: Radiation Oncology

## 2017-10-20 ENCOUNTER — Inpatient Hospital Stay: Payer: Medicare Other

## 2017-10-20 ENCOUNTER — Inpatient Hospital Stay (HOSPITAL_BASED_OUTPATIENT_CLINIC_OR_DEPARTMENT_OTHER): Payer: Medicare Other | Admitting: Hematology

## 2017-10-20 VITALS — BP 117/75 | HR 66 | Temp 98.4°F | Resp 17 | Ht 69.0 in | Wt 195.0 lb

## 2017-10-20 DIAGNOSIS — C760 Malignant neoplasm of head, face and neck: Secondary | ICD-10-CM

## 2017-10-20 DIAGNOSIS — C090 Malignant neoplasm of tonsillar fossa: Secondary | ICD-10-CM | POA: Diagnosis not present

## 2017-10-20 DIAGNOSIS — Z8042 Family history of malignant neoplasm of prostate: Secondary | ICD-10-CM

## 2017-10-20 DIAGNOSIS — E119 Type 2 diabetes mellitus without complications: Secondary | ICD-10-CM

## 2017-10-20 DIAGNOSIS — Z510 Encounter for antineoplastic radiation therapy: Secondary | ICD-10-CM | POA: Diagnosis not present

## 2017-10-20 DIAGNOSIS — R8581 Anal high risk human papillomavirus (HPV) DNA test positive: Secondary | ICD-10-CM

## 2017-10-20 DIAGNOSIS — R1310 Dysphagia, unspecified: Secondary | ICD-10-CM

## 2017-10-20 DIAGNOSIS — Z87891 Personal history of nicotine dependence: Secondary | ICD-10-CM

## 2017-10-20 DIAGNOSIS — C770 Secondary and unspecified malignant neoplasm of lymph nodes of head, face and neck: Secondary | ICD-10-CM | POA: Diagnosis not present

## 2017-10-20 DIAGNOSIS — C099 Malignant neoplasm of tonsil, unspecified: Secondary | ICD-10-CM | POA: Diagnosis not present

## 2017-10-20 LAB — CBC WITH DIFFERENTIAL (CANCER CENTER ONLY)
BASOS ABS: 0 10*3/uL (ref 0.0–0.1)
Basophils Relative: 0 %
EOS ABS: 0.2 10*3/uL (ref 0.0–0.5)
EOS PCT: 4 %
HCT: 45.3 % (ref 38.4–49.9)
Hemoglobin: 15.6 g/dL (ref 13.0–17.1)
Lymphocytes Relative: 10 %
Lymphs Abs: 0.5 10*3/uL — ABNORMAL LOW (ref 0.9–3.3)
MCH: 31.6 pg (ref 27.2–33.4)
MCHC: 34.4 g/dL (ref 32.0–36.0)
MCV: 91.9 fL (ref 79.3–98.0)
MONO ABS: 0.5 10*3/uL (ref 0.1–0.9)
Monocytes Relative: 9 %
Neutro Abs: 4.2 10*3/uL (ref 1.5–6.5)
Neutrophils Relative %: 77 %
PLATELETS: 218 10*3/uL (ref 140–400)
RBC: 4.93 MIL/uL (ref 4.20–5.82)
RDW: 12.6 % (ref 11.0–14.6)
WBC Count: 5.5 10*3/uL (ref 4.0–10.3)

## 2017-10-20 LAB — COMPREHENSIVE METABOLIC PANEL
ALBUMIN: 3.9 g/dL (ref 3.5–5.0)
ALK PHOS: 87 U/L (ref 40–150)
ALT: 28 U/L (ref 0–55)
ANION GAP: 5 (ref 3–11)
AST: 19 U/L (ref 5–34)
BILIRUBIN TOTAL: 0.2 mg/dL (ref 0.2–1.2)
BUN: 19 mg/dL (ref 7–26)
CALCIUM: 9.5 mg/dL (ref 8.4–10.4)
CO2: 30 mmol/L — ABNORMAL HIGH (ref 22–29)
CREATININE: 1.27 mg/dL (ref 0.70–1.30)
Chloride: 100 mmol/L (ref 98–109)
GFR calc Af Amer: >60 mL/min (ref >60)
GFR calc non Af Amer: 57 mL/min — ABNORMAL LOW (ref >60)
GLUCOSE: 292 mg/dL — AB (ref 70–140)
Potassium: 4.6 mmol/L (ref 3.5–5.1)
Sodium: 135 mmol/L — ABNORMAL LOW (ref 136–145)
TOTAL PROTEIN: 7.5 g/dL (ref 6.4–8.3)

## 2017-10-20 LAB — RETICULOCYTES
RBC.: 4.93 MIL/uL (ref 4.20–5.82)
Retic Count, Absolute: 54.2 10*3/uL (ref 34.8–93.9)
Retic Ct Pct: 1.1 % (ref 0.8–1.8)

## 2017-10-20 MED ORDER — HYDROCODONE-ACETAMINOPHEN 7.5-325 MG/15ML PO SOLN: 10.0000 mL | ORAL | 0 refills | Status: DC | PRN

## 2017-10-20 NOTE — Therapy (Signed)
[facility] [address] [place], [place], [address] Phone: [phone]   Fax:  [phone]  Speech Language Pathology Treatment  Patient Details  Name: Tracy [name] MRN: [id number] Date of Birth: [birth year] Referring Provider: [name] MD   Encounter Date: 10/20/2017  End of Session - 10/20/17 1112    Visit Number  2    Number of Visits  4    Date for SLP Re-Evaluation  12/08/17    SLP Start Time  1032    SLP Stop Time   1100    SLP Time Calculation (min)  28 min    Activity Tolerance  Patient tolerated treatment well       Past Medical History:  Diagnosis Date  . Diabetes mellitus ([place])   . Glaucoma   . Kidney stones 1985  . Neck mass 12/24/2016    Past Surgical History:  Procedure Laterality Date  . 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 FINE NEEDLE ASPIRATION  12/25/2016   Left neck, Level 2  . NASAL SEPTUM SURGERY     remotely  . TOE SURGERY     broken toe with bone ground down.     There were no vitals filed for this visit.  Subjective Assessment - 10/20/17 1041    Currently in Pain?  No/denies            ADULT SLP TREATMENT - 10/20/17 1039      General Information   Behavior/Cognition  Alert;Cooperative;Pleasant mood      Treatment Provided   Treatment provided  Dysphagia      Dysphagia Treatment   Temperature Spikes Noted  No    Treatment Methods  Therapeutic exercise;Skilled observation    Patient observed directly with PO's  Yes    Type of PO's observed  Thin liquids politely refused solids due to dysgeusia    Liquids provided via  Cup    Oral Phase Signs & Symptoms  -- none noted    Pharyngeal Phase Signs & Symptoms  -- none noted    Other treatment/comments  Pt told SLP why he was completing HEP without cues. HEP completed with independence. SLP educated pt re: doing swallowing exercises in 1-rep succession up to as many as he can complete. Also educated pt  re: doing non-swallowing HEP exercises if need be, as pt is coming up on last two weeks of rad tx.      Assessment / Recommendations / Plan   Plan  Continue with current plan of care      Dysphagia Recommendations   Diet recommendations  Thin liquid diet as tolerated    Liquids provided via  Cup    Medication Administration  Whole meds with liquid      Progression Toward Goals   Progression toward goals  Progressing toward goals       SLP Education - 10/20/17 1111    Education provided  Yes    Education Details  HEP in succession for swallowing exercises on HEP; do non-swallowing exercises on HEP if need be    Person(s) Educated  Patient;Spouse;Child(ren)    Methods  Explanation    Comprehension  Verbalized understanding       SLP Short Term Goals - 10/20/17 1040      SLP SHORT TERM GOAL #1   Title  pt will complete HEP with occasional min A     Time  --    Period  --  Status  Achieved      SLP SHORT TERM GOAL #2   Title  pt will tell SLP why he is completing HEP     Time  --    Period  --    Status  Achieved       SLP Long Term Goals - 10/20/17 1048      SLP LONG TERM GOAL #1   Title  pt will complete HEP with modified independence     Time  3    Period  -- visits (4th total visit)    Status  On-going      SLP LONG TERM GOAL #2   Title  pt will tell SLP when HEP frequency can be reduced to x2-3/week    Time  3    Period  -- visits (4th total visit)    Status  On-going      SLP LONG TERM GOAL #3   Title  pt will tell SLP 3 overt s/s of aspiration PNA with modified independence    Time  2    Period  -- visits (3rd total visit)    Status  On-going      SLP LONG TERM GOAL #4   Title  pt will tell SLP how a food journal can assist with return to pt's safest diet    Time  2    Period  -- visits (3rd total visit)    Status  On-going       Plan - 10/20/17 1112    Clinical Impression Statement  Pt with oropharyngeal swallowing essentially WNL, however  the probability of swallowing difficulty increases dramatically with the initiation of radiation therapy. Pt will need to cont to be followed by SLP for regular assessment of accurate HEP completion as well as for safety with POs both during and following treatment/s.    Speech Therapy Frequency  -- approx once every four weeks    Duration  -- 4 total visits (eval + 3 therapy visits)    Treatment/Interventions  Aspiration precaution training;Pharyngeal strengthening exercises;Diet toleration management by SLP;Trials of upgraded texture/liquids;Internal/external aids;Patient/family education;Compensatory strategies;SLP instruction and feedback;Cueing hierarchy;Environmental controls    Potential to Achieve Goals  Good    SLP Home Exercise Plan  provided today    Consulted and Agree with Plan of Care  Patient       Patient will benefit from skilled therapeutic intervention in order to improve the following deficits and impairments:   Dysphagia, unspecified type    Problem List Patient Active Problem List   Diagnosis Date Noted  . Cancer of tonsillar fossa ([place]) 01/29/2017    [facility] ,[place], CCC-SLP' 10/20/2017, 11:13 AM  [facility] [address] [place], [place], [address] Phone: [phone]   Fax:  [phone]   Name: Tracy [name] MRN: [id number] Date of Birth: [birth year]

## 2017-10-20 NOTE — Telephone Encounter (Signed)
Appointments scheduled avs/calendar printed per 5/20 los

## 2017-10-21 ENCOUNTER — Inpatient Hospital Stay: Payer: Medicare Other

## 2017-10-21 ENCOUNTER — Ambulatory Visit
Admission: RE | Admit: 2017-10-21 | Discharge: 2017-10-21 | Disposition: A | Discharge status: Home / Self Care | Payer: Medicare Other | Source: Ambulatory Visit | Attending: Radiation Oncology | Admitting: Radiation Oncology

## 2017-10-21 DIAGNOSIS — Z510 Encounter for antineoplastic radiation therapy: Secondary | ICD-10-CM | POA: Diagnosis not present

## 2017-10-21 DIAGNOSIS — C090 Malignant neoplasm of tonsillar fossa: Secondary | ICD-10-CM | POA: Diagnosis not present

## 2017-10-21 NOTE — Progress Notes (Addendum)
Nutrition Follow-up:  Patient being treated for tongue cancer, currently receiving radiation therapy.    Met with patient, wife and daughter following radiation therapy.  Patient reports that he is no longer able to tolerate eggs. Was drinking 9 ensure original but switched to ensure plus on Sunday.    Continues to report dry mouth and altered taste.  Also has sore throat.    Patient wife tried smoothie but patient did not like it and was not able to drink it.  Patient also drinking water and gatorade.     Medications: reviewed  Labs: reviewed  Anthropometrics:   Weight measured today in RD office 192 lb 8 oz increased from 190 lb 8 oz on 5/16.  May 2 patient was 196 lb   Estimated Energy Needs  Kcals: 6294-7654 calories Protein: 110-120 g Fluid: 2.5 L  NUTRITION DIAGNOSIS:  Food and nutrition related knowledge deficit continues   INTERVENTION:   Recommend patient to drink 7 bottles of  ensure plus/ensure enlive as he is not getting calories from eggs.  Will provide 2450 calories and 91- 140 g of protein.  Patient verbalized understanding.   Will continue to drink decaffinated beverages to keep hydrated.  1 st case of ensure enlive given today.     MONITORING, EVALUATION, GOAL: Patient will tolerate oral nutrition supplements to meet greater than 90% of estimated energy needs   NEXT VISIT: Wednesday, May 29 after radiation  Terrilynn Postell B. Zenia Resides, [place], [place] Registered Dietitian [phone] (pager)

## 2017-10-22 ENCOUNTER — Ambulatory Visit
Admission: RE | Admit: 2017-10-22 | Discharge: 2017-10-22 | Disposition: A | Discharge status: Home / Self Care | Payer: Medicare Other | Source: Ambulatory Visit | Attending: Radiation Oncology | Admitting: Radiation Oncology

## 2017-10-22 DIAGNOSIS — Z510 Encounter for antineoplastic radiation therapy: Secondary | ICD-10-CM | POA: Diagnosis not present

## 2017-10-22 DIAGNOSIS — C090 Malignant neoplasm of tonsillar fossa: Secondary | ICD-10-CM | POA: Diagnosis not present

## 2017-10-23 ENCOUNTER — Ambulatory Visit
Admission: RE | Admit: 2017-10-23 | Discharge: 2017-10-23 | Disposition: A | Discharge status: Home / Self Care | Payer: Medicare Other | Source: Ambulatory Visit | Attending: Radiation Oncology | Admitting: Radiation Oncology

## 2017-10-23 DIAGNOSIS — Z510 Encounter for antineoplastic radiation therapy: Secondary | ICD-10-CM | POA: Diagnosis not present

## 2017-10-23 DIAGNOSIS — C090 Malignant neoplasm of tonsillar fossa: Secondary | ICD-10-CM | POA: Diagnosis not present

## 2017-10-24 ENCOUNTER — Ambulatory Visit
Admission: RE | Admit: 2017-10-24 | Discharge: 2017-10-24 | Disposition: A | Discharge status: Home / Self Care | Payer: Medicare Other | Source: Ambulatory Visit | Attending: Radiation Oncology | Admitting: Radiation Oncology

## 2017-10-24 ENCOUNTER — Encounter: Payer: Self-pay | Admitting: Radiation Oncology

## 2017-10-24 DIAGNOSIS — C090 Malignant neoplasm of tonsillar fossa: Secondary | ICD-10-CM | POA: Diagnosis not present

## 2017-10-24 DIAGNOSIS — Z510 Encounter for antineoplastic radiation therapy: Secondary | ICD-10-CM | POA: Diagnosis not present

## 2017-10-24 NOTE — Progress Notes (Signed)
Rec'd disability packet from pt wife who was anxious to have it back asap. Tracy [name] not here so I have asked nursing to complete and return it to me to get it back to the patient next week.

## 2017-10-28 ENCOUNTER — Ambulatory Visit
Admission: RE | Admit: 2017-10-28 | Discharge: 2017-10-28 | Disposition: A | Discharge status: Home / Self Care | Payer: Medicare Other | Source: Ambulatory Visit | Attending: Radiation Oncology | Admitting: Radiation Oncology

## 2017-10-28 DIAGNOSIS — Z510 Encounter for antineoplastic radiation therapy: Secondary | ICD-10-CM | POA: Diagnosis not present

## 2017-10-28 DIAGNOSIS — C090 Malignant neoplasm of tonsillar fossa: Secondary | ICD-10-CM | POA: Diagnosis not present

## 2017-10-28 MED ORDER — SONAFINE EX EMUL
1.0000 "application " | Freq: Once | CUTANEOUS | Status: AC
  Administered 2017-10-28: 1 via TOPICAL

## 2017-10-29 ENCOUNTER — Ambulatory Visit
Admission: RE | Admit: 2017-10-29 | Discharge: 2017-10-29 | Disposition: A | Discharge status: Home / Self Care | Payer: Medicare Other | Source: Ambulatory Visit | Attending: Radiation Oncology | Admitting: Radiation Oncology

## 2017-10-29 ENCOUNTER — Inpatient Hospital Stay: Payer: Medicare Other | Admitting: Nutrition

## 2017-10-29 DIAGNOSIS — Z510 Encounter for antineoplastic radiation therapy: Secondary | ICD-10-CM | POA: Diagnosis not present

## 2017-10-29 DIAGNOSIS — C090 Malignant neoplasm of tonsillar fossa: Secondary | ICD-10-CM | POA: Diagnosis not present

## 2017-10-29 NOTE — Progress Notes (Signed)
Nutrition follow-up completed with patient, wife, daughter after radiation therapy for tongue cancer. Weight decreased and documented as 191 pounds. Patient reports he continues to have taste alterations. He is only drinking water and Ensure. Reports he drinks 8 Ensure Plus or 9 regular ensures daily. He has no other concerns today.  Estimated energy needs: 2300-2500 cal, 110-120 g protein, 2.5 L fluid.  Nutrition diagnosis: Food and nutrition related knowledge deficit continues.  Intervention: Recommended patient continue to try to swallow throughout treatment. Recommended patient utilize 6 Ensure Plus and 2 ensure Enlive daily to provide approximately 2800 cal. Patient can alternate with regular Ensure until his supply runs out. Support was provided.  Teach back method used.  Monitoring, evaluation, goals: Patient will tolerate oral nutrition supplements to meet estimated nutrition needs to minimize further weight loss.  Next visit: Tuesday, June 4 after radiation therapy.  **Disclaimer: This note was dictated with voice recognition software. Similar sounding words can inadvertently be transcribed and this note may contain transcription errors which may not have been corrected upon publication of note.**

## 2017-10-30 ENCOUNTER — Ambulatory Visit
Admission: RE | Admit: 2017-10-30 | Discharge: 2017-10-30 | Disposition: A | Discharge status: Home / Self Care | Payer: Medicare Other | Source: Ambulatory Visit | Attending: Radiation Oncology | Admitting: Radiation Oncology

## 2017-10-30 DIAGNOSIS — Z510 Encounter for antineoplastic radiation therapy: Secondary | ICD-10-CM | POA: Diagnosis not present

## 2017-10-30 DIAGNOSIS — C090 Malignant neoplasm of tonsillar fossa: Secondary | ICD-10-CM | POA: Diagnosis not present

## 2017-10-30 NOTE — Progress Notes (Signed)
FMLA successfully faxed to [employer] at [phone]. Mailed copy to patient address on file.

## 2017-10-31 ENCOUNTER — Ambulatory Visit
Admission: RE | Admit: 2017-10-31 | Discharge: 2017-10-31 | Disposition: A | Discharge status: Home / Self Care | Payer: Medicare Other | Source: Ambulatory Visit | Attending: Radiation Oncology | Admitting: Radiation Oncology

## 2017-10-31 DIAGNOSIS — Z510 Encounter for antineoplastic radiation therapy: Secondary | ICD-10-CM | POA: Diagnosis not present

## 2017-10-31 DIAGNOSIS — C090 Malignant neoplasm of tonsillar fossa: Secondary | ICD-10-CM | POA: Diagnosis not present

## 2017-11-03 ENCOUNTER — Ambulatory Visit
Admission: RE | Admit: 2017-11-03 | Discharge: 2017-11-03 | Disposition: A | Discharge status: Home / Self Care | Payer: Medicare Other | Source: Ambulatory Visit | Attending: Radiation Oncology | Admitting: Radiation Oncology

## 2017-11-03 DIAGNOSIS — Z510 Encounter for antineoplastic radiation therapy: Secondary | ICD-10-CM | POA: Insufficient documentation

## 2017-11-03 DIAGNOSIS — C090 Malignant neoplasm of tonsillar fossa: Secondary | ICD-10-CM | POA: Insufficient documentation

## 2017-11-03 MED ORDER — SONAFINE EX EMUL
1.0000 "application " | Freq: Once | CUTANEOUS | Status: AC
  Administered 2017-11-03: 1 via TOPICAL

## 2017-11-04 ENCOUNTER — Ambulatory Visit
Admission: RE | Admit: 2017-11-04 | Discharge: 2017-11-04 | Disposition: A | Discharge status: Home / Self Care | Payer: Medicare Other | Source: Ambulatory Visit | Attending: Radiation Oncology | Admitting: Radiation Oncology

## 2017-11-04 ENCOUNTER — Inpatient Hospital Stay: Payer: Medicare Other | Attending: Radiation Oncology | Admitting: Nutrition

## 2017-11-04 DIAGNOSIS — C760 Malignant neoplasm of head, face and neck: Secondary | ICD-10-CM | POA: Insufficient documentation

## 2017-11-04 DIAGNOSIS — E119 Type 2 diabetes mellitus without complications: Secondary | ICD-10-CM | POA: Insufficient documentation

## 2017-11-04 DIAGNOSIS — C099 Malignant neoplasm of tonsil, unspecified: Secondary | ICD-10-CM | POA: Insufficient documentation

## 2017-11-04 DIAGNOSIS — C090 Malignant neoplasm of tonsillar fossa: Secondary | ICD-10-CM | POA: Diagnosis not present

## 2017-11-04 DIAGNOSIS — Z87891 Personal history of nicotine dependence: Secondary | ICD-10-CM | POA: Insufficient documentation

## 2017-11-04 DIAGNOSIS — Z510 Encounter for antineoplastic radiation therapy: Secondary | ICD-10-CM | POA: Diagnosis not present

## 2017-11-04 NOTE — Progress Notes (Signed)
Nutrition follow-up completed with patient after radiation therapy for tongue cancer. Patient finishes radiation therapy tomorrow. Weight decreased and documented as 188.6 pounds down from 191 pounds. Patient reports he continues to have taste alterations. He is drinking the equivalent of 8 Ensure Plus daily Patient denies pain on swallowing.  He has no other nutrition impact symptoms.  Estimated energy needs: 2300-2500 cal, 110-120 g protein, 2.5 L fluid.  Nutrition diagnosis: Food and nutrition related knowledge deficit continues.  Intervention: Patient educated to continue swallowing oral intake as possible He should continue Ensure Plus or equivalent 8 bottles daily providing 2800 cal. Offered additional Ensure Enlive for patient but he declined at this time.  Monitoring, evaluation, goals: Patient will work to increase calories and protein and begin oral intake of soft foods as soon as tolerated.  Next visit: To be scheduled as needed.  **Disclaimer: This note was dictated with voice recognition software. Similar sounding words can inadvertently be transcribed and this note may contain transcription errors which may not have been corrected upon publication of note.**

## 2017-11-05 ENCOUNTER — Ambulatory Visit
Admission: RE | Admit: 2017-11-05 | Discharge: 2017-11-05 | Disposition: A | Discharge status: Home / Self Care | Payer: Medicare Other | Source: Ambulatory Visit | Attending: Radiation Oncology | Admitting: Radiation Oncology

## 2017-11-05 DIAGNOSIS — C090 Malignant neoplasm of tonsillar fossa: Secondary | ICD-10-CM | POA: Diagnosis not present

## 2017-11-05 DIAGNOSIS — Z510 Encounter for antineoplastic radiation therapy: Secondary | ICD-10-CM | POA: Diagnosis not present

## 2017-11-11 ENCOUNTER — Encounter: Payer: Self-pay | Admitting: Radiation Oncology

## 2017-11-11 NOTE — Progress Notes (Signed)
  Radiation Oncology         [phone] ________________________________  Name: Tracy [name] MRN: [id number]  Date: 11/11/2017  DOB: 1951-03-29  End of Treatment Note  Diagnosis: Cancer of tonsillar fossa   Indication for treatment:  Curative and post-op     Radiation treatment dates:  09/24/2017 - 11/05/2017  Site/dose:    HN_Lt_tonsil bed and bilateral neck/ 60 Gy delivered in 30 fractions of 2 Gy  Beams/energy:   IMRT/ 6X  Narrative: The patient tolerated radiation treatment relatively well. Towards the beginning, he felt well overall. He reported tightness to his surgical scar post-radiation. By the end, he reported fatigue and changes in taste. The skin on his neck was red and shiny, especially to the left side. He denied pain throughout.  Plan: The patient has completed radiation treatment. The patient will return to radiation oncology clinic for routine followup in one month. I advised them to call or return sooner if they have any questions or concerns related to their recovery or treatment.  -----------------------------------  [name], MD  This document serves as a record of services personally performed by [name], MD. It was created on his behalf by Wilburn Mylar, a trained medical scribe. The creation of this record is based on the scribe's personal observations and the provider's statements to them. This document has been checked and approved by the attending provider.

## 2017-11-12 ENCOUNTER — Encounter: Payer: Self-pay | Admitting: Radiation Oncology

## 2017-11-12 NOTE — Progress Notes (Signed)
Tracy [name] presents for follow up of radiation completed 11/05/17 to his head and neck/ Left Tonsil.   Pain issues, if any: No Using a feeding tube?:  No, Pt drinks 6 cans of ensure per day. 2 of the cans are glucerna because blood sugar numbers were running high. Weight changes, if any: No, todays' weight 187.6 lbs Swallowing issues, if any: Pt states No, problems with swallowing. Pt denies having pain or difficulty when swallowing.  Smoking or chewing tobacco? No Using fluoride trays daily? Using flouride trays once per day Last ENT visit was on: July 2018 Other notable issues, if any:  11/17/17 Dr. Irene Limbo and lab. 11/17/17 Garald Balding. Pt states that he will have therapy will Glendell Docker in 2 months. Pt states that he has dry mouth and is using Biotene. Pt states that it helps for 3-4 hours. Pt states thick white saliva. Pt states that things don't taste the same. Pt states it is an after taste. Pt denies having mucositis or mouth sores. Pt skin has some redness and peeling. Pt states that he is using the sonafine lotion 1 time per day. Pt states that skin feels tight.   BP (!) 115/48   Pulse 77   Temp 97.8 F (36.6 C)   Resp 18   Wt 187 lb 9.6 oz (85.1 kg)   SpO2 98%   BMI 27.70 kg/m   Wt Readings from Last 3 Encounters:  11/19/17 187 lb 9.6 oz (85.1 kg)  11/17/17 186 lb 6.4 oz (84.6 kg)  11/04/17 188 lb 9.6 oz (85.5 kg)

## 2017-11-13 DIAGNOSIS — Z6826 Body mass index (BMI) 26.0-26.9, adult: Secondary | ICD-10-CM | POA: Diagnosis not present

## 2017-11-13 DIAGNOSIS — I10 Essential (primary) hypertension: Secondary | ICD-10-CM | POA: Diagnosis not present

## 2017-11-13 DIAGNOSIS — C091 Malignant neoplasm of tonsillar pillar (anterior) (posterior): Secondary | ICD-10-CM | POA: Diagnosis not present

## 2017-11-13 DIAGNOSIS — E1165 Type 2 diabetes mellitus with hyperglycemia: Secondary | ICD-10-CM | POA: Diagnosis not present

## 2017-11-13 DIAGNOSIS — E782 Mixed hyperlipidemia: Secondary | ICD-10-CM | POA: Diagnosis not present

## 2017-11-17 ENCOUNTER — Encounter: Payer: Self-pay | Admitting: Hematology

## 2017-11-17 ENCOUNTER — Ambulatory Visit: Payer: Medicare Other | Attending: Radiation Oncology

## 2017-11-17 ENCOUNTER — Inpatient Hospital Stay: Payer: Medicare Other

## 2017-11-17 ENCOUNTER — Inpatient Hospital Stay (HOSPITAL_BASED_OUTPATIENT_CLINIC_OR_DEPARTMENT_OTHER): Payer: Medicare Other | Admitting: Hematology

## 2017-11-17 ENCOUNTER — Encounter: Payer: Self-pay | Admitting: *Deleted

## 2017-11-17 ENCOUNTER — Telehealth: Payer: Self-pay | Admitting: Hematology

## 2017-11-17 VITALS — BP 115/77 | HR 65 | Temp 98.5°F | Resp 17 | Ht 69.0 in | Wt 186.4 lb

## 2017-11-17 DIAGNOSIS — R1310 Dysphagia, unspecified: Secondary | ICD-10-CM | POA: Diagnosis not present

## 2017-11-17 DIAGNOSIS — Z87891 Personal history of nicotine dependence: Secondary | ICD-10-CM

## 2017-11-17 DIAGNOSIS — C760 Malignant neoplasm of head, face and neck: Secondary | ICD-10-CM

## 2017-11-17 DIAGNOSIS — E119 Type 2 diabetes mellitus without complications: Secondary | ICD-10-CM

## 2017-11-17 DIAGNOSIS — C099 Malignant neoplasm of tonsil, unspecified: Secondary | ICD-10-CM | POA: Diagnosis not present

## 2017-11-17 DIAGNOSIS — C090 Malignant neoplasm of tonsillar fossa: Secondary | ICD-10-CM

## 2017-11-17 LAB — CMP (CANCER CENTER ONLY)
ALT: 23 U/L (ref 0–55)
AST: 23 U/L (ref 5–34)
Albumin: 3.6 g/dL (ref 3.5–5.0)
Alkaline Phosphatase: 80 U/L (ref 40–150)
Anion gap: 7 (ref 3–11)
BUN: 20 mg/dL (ref 7–26)
CO2: 29 mmol/L (ref 22–29)
Calcium: 9.4 mg/dL (ref 8.4–10.4)
Chloride: 100 mmol/L (ref 98–109)
Creatinine: 1.27 mg/dL (ref 0.70–1.30)
GFR, Estimated: 57 mL/min — ABNORMAL LOW (ref >60)
Glucose, Bld: 300 mg/dL — ABNORMAL HIGH (ref 70–140)
POTASSIUM: 4.3 mmol/L (ref 3.5–5.1)
SODIUM: 136 mmol/L (ref 136–145)
Total Bilirubin: 0.3 mg/dL (ref 0.2–1.2)
Total Protein: 7 g/dL (ref 6.4–8.3)

## 2017-11-17 LAB — CBC WITH DIFFERENTIAL/PLATELET
Basophils Absolute: 0 10*3/uL (ref 0.0–0.1)
Basophils Relative: 0 %
Eosinophils Absolute: 0.1 10*3/uL (ref 0.0–0.5)
Eosinophils Relative: 3 %
HEMATOCRIT: 43.1 % (ref 38.4–49.9)
HEMOGLOBIN: 14.8 g/dL (ref 13.0–17.1)
LYMPHS PCT: 10 %
Lymphs Abs: 0.4 10*3/uL — ABNORMAL LOW (ref 0.9–3.3)
MCH: 31.5 pg (ref 27.2–33.4)
MCHC: 34.3 g/dL (ref 32.0–36.0)
MCV: 92.1 fL (ref 79.3–98.0)
Monocytes Absolute: 0.4 10*3/uL (ref 0.1–0.9)
Monocytes Relative: 10 %
Neutro Abs: 3.3 10*3/uL (ref 1.5–6.5)
Neutrophils Relative %: 77 %
Platelets: 205 10*3/uL (ref 140–400)
RBC: 4.68 MIL/uL (ref 4.20–5.82)
RDW: 13.6 % (ref 11.0–14.6)
WBC: 4.2 10*3/uL (ref 4.0–10.3)

## 2017-11-17 NOTE — Progress Notes (Signed)
Oncology Nurse Navigator Documentation  Met with Tracy [name] in [facility] lobby prior to appt with Dr. Irene Limbo to provide post-RT guidance.  He completed RT 6/5 for L tonsilar cancer.  He was accompanied by his wife and dtr. Provided wife and dtr with a Certificate of Recognition for their supportive care. Provided verbal/written post-RT guidance:  Importance of keeping all follow-up appts, especially those with Nutrition and SLP.  Importance of protecting treatment area from sun.  Continuation of Sonafine application 2-3 times daily until supply exhausted after which transition to OTC lotion with vitamin E. Explained my role as navigator will continue for several more months and that I will be calling and/or joining him during follow-up visits.   I encouraged him to call me with needs/concerns.   Patient and wife verbalized understanding of information provided.  Gayleen Orem, RN, BSN Head & Neck Oncology [place] [facility] [place] [phone]

## 2017-11-17 NOTE — Therapy (Signed)
[facility] [address] [place], [place], [address] Phone: [phone]   Fax:  [phone]  Speech Language Pathology Treatment  Patient Details  Name: Tracy [name] MRN: [id number] Date of Birth: [birth year] Referring Provider: [name] MD   Encounter Date: 11/17/2017  End of Session - 11/17/17 1714    Visit Number  3    Number of Visits  4    Date for SLP Re-Evaluation  12/08/17    SLP Start Time  25    SLP Stop Time   1146    SLP Time Calculation (min)  41 min    Activity Tolerance  Patient tolerated treatment well       Past Medical History:  Diagnosis Date  . Diabetes mellitus ([place])   . Glaucoma   . History of radiation therapy 09/24/17- 11/05/17   head and neck, Left tonsil/ 60 gy delivered in 30 fractions of 2 Gy.   . Kidney stones 1985  . Neck mass 12/24/2016    Past Surgical History:  Procedure Laterality Date  . 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 FINE NEEDLE ASPIRATION  12/25/2016   Left neck, Level 2  . NASAL SEPTUM SURGERY     remotely  . TOE SURGERY     broken toe with bone ground down.     There were no vitals filed for this visit.  Subjective Assessment - 11/17/17 1711    Subjective  Pt tells SLP he still has significant taste aversions.     Patient is accompained by:  Family member wife and daughter    Currently in Pain?  No/denies            ADULT SLP TREATMENT - 11/17/17 1111      General Information   Behavior/Cognition  Alert;Cooperative;Pleasant mood      Treatment Provided   Treatment provided  Dysphagia      Dysphagia Treatment   Temperature Spikes Noted  No    Respiratory Status  Room air    Treatment Methods  Patient/caregiver education;Skilled observation;Therapeutic exercise    Patient observed directly with PO's  Yes    Type of PO's observed  Thin liquids;Dysphagia 1 (puree)    Liquids provided via  Cup    Oral Phase Signs & Symptoms  --  none noted    Pharyngeal Phase Signs & Symptoms  -- none noted    Other treatment/comments  Pt completed HEP independently, ate puree and drank H2O without overt s/s aspiration. Pt reported trying other items more along lines of dys II-III however taste makes if very difficult to ingest anything other than Ensure at present. SLP educated pt re: aspiration PNA, and how a food journal could assist him in his return to regular solid POs re: taste.       Assessment / Recommendations / Plan   Plan  -- every other month      Dysphagia Recommendations   Diet recommendations  Thin liquid diet as tolerated    Liquids provided via  Cup    Medication Administration  Whole meds with liquid      Progression Toward Goals   Progression toward goals  Progressing toward goals       SLP Education - 11/17/17 1714    Education provided  Yes    Education Details  food journal, overt s/s aspiration PNA    Person(s) Educated  Patient    Methods  Explanation;Handout  Comprehension  Verbalized understanding       SLP Short Term Goals - 10/20/17 1040      SLP SHORT TERM GOAL #1   Title  pt will complete HEP with occasional min A     Time  --    Period  --    Status  Achieved      SLP SHORT TERM GOAL #2   Title  pt will tell SLP why he is completing HEP     Time  --    Period  --    Status  Achieved       SLP Long Term Goals - 11/17/17 1134      SLP LONG TERM GOAL #1   Title  pt will complete HEP with modified independence over a two month break between sessions    Time  2    Period  -- visits (4th total visit)    Status  Revised      SLP LONG TERM GOAL #2   Title  pt will tell SLP when HEP frequency can be reduced to x2-3/week    Time  2    Period  -- visits (4th total visit)    Status  On-going      SLP LONG TERM GOAL #3   Title  pt will tell SLP 3 overt s/s of aspiration PNA with modified independence    Status  Achieved      SLP LONG TERM GOAL #4   Title  pt will tell SLP how  a food journal can assist with return to pt's safest diet    Status  Achieved       Plan - 11/17/17 1714    Clinical Impression Statement  Pt with oropharyngeal swallowing essentially WNL for puree and water, however the probability of swallowing difficulty increases dramatically with the completion of radiation therapy. Pt appears safe at this time with at least puree and thin liquids, however reports dys II-III items rarely ingested primarily due to taste aversions. Pt will be seen in two months due to progress (see goal update). Pt will need to cont to be followed by SLP for regular assessment of accurate HEP completion as well as for safety with POs both during and following treatment/s.    Speech Therapy Frequency  -- approx once every 8 weeks    Duration  -- 4 total visits (eval + 3 therapy visits)    Treatment/Interventions  Aspiration precaution training;Pharyngeal strengthening exercises;Diet toleration management by SLP;Trials of upgraded texture/liquids;Internal/external aids;Patient/family education;Compensatory strategies;SLP instruction and feedback;Cueing hierarchy;Environmental controls    Potential to Achieve Goals  Good    SLP Home Exercise Plan  provided today    Consulted and Agree with Plan of Care  Patient       Patient will benefit from skilled therapeutic intervention in order to improve the following deficits and impairments:   Dysphagia, unspecified type    Problem List Patient Active Problem List   Diagnosis Date Noted  . Cancer of tonsillar fossa ([place]) 01/29/2017    [facility] ,[place], [place]  11/17/2017, 5:16 PM  [facility] [address] [place], [place], [address] Phone: [phone]   Fax:  [phone]   Name: Tracy [name] MRN: [id number] Date of Birth: Feb 21, 1951

## 2017-11-17 NOTE — Progress Notes (Signed)
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 male who has been referred to [place] by Dr. [name], his ENT, for evaluation and management of Squamous Cell Carcinoma of the head and Neck. The patient's last visit with [place] was on 10/20/17. He is accompanied today by his wife and daughter. The pt reports that he is doing well overall.   The pt reports that he is consuming PO at this time and is using 6-9 cans of Ensure each day. He notes that he is near his normal weight right now. He denies any problems swallowing at this time. He will be seeing Dr Isidore Moos on 11/19/17. He has used Biotene mouthwash and has not needed to use any other mouthwashes.   He notes that his sugars have been high post-prandial, but normal at fasting. He is continuing follow up with his PCP regarding this and is taking 32 units twice a day.   Lab results today (11/17/17) of CBC, CMP is as follows: all values are WNL except for Lymphs abs at 400, Glucose at 300.  On review of systems, pt reports good energy levels, mild throat pain,  and denies fevers, chills, night sweats, swallowing difficulty, abdominal pains, leg swelling, and any other symptoms.   MEDICAL HISTORY:  Past Medical History:  Diagnosis Date  . Diabetes mellitus ([place])   . Glaucoma   . History of radiation therapy 09/24/17- 11/05/17   head and neck, Left tonsil/ 60 gy delivered in 30 fractions of 2 Gy.   . Kidney stones  1985  . Neck mass 12/24/2016    SURGICAL HISTORY: Past Surgical History:  Procedure Laterality Date  . 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 FINE NEEDLE ASPIRATION  12/25/2016   Left neck, Level 2  . NASAL SEPTUM SURGERY     remotely  . TOE SURGERY     broken toe with bone ground down.     SOCIAL HISTORY: Social History   Socioeconomic History  . Marital status: Married    Spouse name: Not on file  . Number of children: Not on file  . Years of education: Not on file  . Highest education level: Not on file  Occupational History  . Not on file  Social Needs  . Financial resource strain: Not on file  . Food insecurity:    Worry: Not on file    Inability: Not on file  . Transportation needs:    Medical: Not on file    Non-medical: Not on file  Tobacco Use  . Smoking status: Former Smoker    Packs/day: 1.00    Years: 10.00    Pack years: 10.00    Types: Cigarettes    Last attempt to quit: 06/03/1985    Years since quitting: 32.4  . Smokeless tobacco: Former Systems developer    Types: [place] date: 06/03/1985  Substance and Sexual Activity  . Alcohol use: No  . Drug use: No  . Sexual  activity: Not on file  Lifestyle  . Physical activity:    Days per week: Not on file    Minutes per session: Not on file  . Stress: Not on file  Relationships  . Social connections:    Talks on phone: Not on file    Gets together: Not on file    Attends religious service: Not on file    Active member of club or organization: Not on file    Attends meetings of clubs or organizations: Not on file    Relationship status: Not on file  . Intimate partner violence:    Fear of current or ex partner: Not on file    Emotionally abused: Not on file    Physically abused: Not on file    Forced sexual activity: Not on file  Other Topics Concern  . Not on file  Social History Narrative  . Not on file    FAMILY HISTORY: His mother had mucosal melanoma and father had prostate  cancer. His sister had stage 4 pancreatic cancer.  His mother and father had DM.  His maternal grandfather had leukemia.  His maternal uncle had brain cancer.   ALLERGIES:  is allergic to bismuth subsalicylate.  MEDICATIONS:  Current Outpatient Medications  Medication Sig Dispense Refill  . insulin NPH-regular Human (NOVOLIN 70/30) (70-30) 100 UNIT/ML injection Inject 32 units subcutaneously every morning and inject 30 units every evening    . lidocaine (XYLOCAINE) 2 % solution Patient: Mix 1part 2% viscous lidocaine, 1part H20. Swish & swallow 30mL of diluted mixture, 28min before meals and at bedtime, up to QID 100 mL 5  . LORazepam (ATIVAN) 0.5 MG tablet Take 1 tablet as needed for anxiety, 30 minutes before treatment. 15 tablet 0  . timolol (TIMOPTIC) 0.5 % ophthalmic solution      No current facility-administered medications for this visit.     REVIEW OF SYSTEMS:   A 10+ POINT REVIEW OF SYSTEMS WAS OBTAINED including neurology, dermatology, psychiatry, cardiac, respiratory, lymph, extremities, GI, GU, Musculoskeletal, constitutional, breasts, reproductive, HEENT.  All pertinent positives are noted in the HPI.  All others are negative.   PHYSICAL EXAMINATION: ECOG PERFORMANCE STATUS: 1 - Symptomatic but completely ambulatory  .VS reviewed in [place], in no acute distress and comfortable SKIN: no acute rashes, no significant lesions EYES: conjunctiva are pink and non-injected, sclera anicteric OROPHARYNX: MMM, no exudates, no oropharyngeal erythema or ulceration NECK: supple, no JVD, healing surgical scar on neck LYMPH:  no palpable lymphadenopathy in the cervical, axillary or inguinal regions LUNGS: clear to auscultation b/l with normal respiratory effort HEART: regular rate & rhythm ABDOMEN:  normoactive bowel sounds , non tender, not distended. Extremity: no pedal edema PSYCH: alert & oriented x 3 with fluent speech NEURO: no focal motor/sensory deficits    LABORATORY DATA:  I have reviewed the data as listed  . CBC Latest Ref Rng & Units 11/17/2017 10/20/2017 04/21/2017  WBC 4.0 - 10.3 K/uL 4.2 5.5 5.1  Hemoglobin 13.0 - 17.1 g/dL 14.8 15.6 15.0  Hematocrit 38.4 - 49.9 % 43.1 45.3 44.0  Platelets 140 - 400 K/uL 205 218 221    . CMP Latest Ref Rng & Units 11/17/2017 10/20/2017 04/21/2017  Glucose 70 - 140 mg/dL 300(H) 292(H) 505(H)  BUN 7 - 26 mg/dL 20 19 17.4  Creatinine 0.70 - 1.30 mg/dL 1.27 1.27 1.4(H)  Sodium 136 - 145 mmol/L 136 135(L) 132(L)  Potassium 3.5 - 5.1 mmol/L 4.3 4.6 4.6  Chloride  98 - 109 mmol/L 100 100 -  CO2 22 - 29 mmol/L 29 30(H) 25  Calcium 8.4 - 10.4 mg/dL 9.4 9.5 9.3  Total Protein 6.4 - 8.3 g/dL 7.0 7.5 7.1  Total Bilirubin 0.2 - 1.2 mg/dL 0.3 0.2 0.39  Alkaline Phos 40 - 150 U/L 80 87 83  AST 5 - 34 U/L 23 19 24   ALT 0 - 55 U/L 23 28 41     PATHOLOGY   Diagnosis 12/25/16   CONSULT SLIDE, LEFT NECK LEVEL 2 MALIGNANT CELLS PRESENT, CONSISTENT WITH SQUAMOUS CELL CARCINOMA. 04/16/2017  03/17/2017  02/20/2017   Surgical Pathology Tissue ExamResulted: 08/19/2017 4:44 PM [facility] Specimen Collected on  Lymph Node 08/15/2017 9:02 PM  Result Narrative    ACCESSION [id number] RECEIVED: 08/15/2017 ORDERING PHYSICIAN:CHRISTOPHER A SULLIVAN , MD PATIENT NAME:Avans, Tracy [name] SURGICAL PATHOLOGY REPORT  FINAL PATHOLOGIC DIAGNOSIS MICROSCOPIC EXAMINATION AND DIAGNOSIS   "LEFT LEVEL 3&5 LYMPH NODE", EXCISON: Metastatic squamous cell carcinoma involving one of eight lymph nodes (1/8).    I have personally reviewed the slides and/or other related materials referenced, and have edited the report as part of my pathologic assessment and final interpretation.  Electronically Signed Out By: Sherlon Handing , MD 08/19/2017 16:44:05    RADIOGRAPHIC STUDIES: I have personally reviewed the radiological images as listed and agreed with the findings in  the report.  PET/CT 07/16/2017 PET HEAD AND NECK CANCER (W/LOW DOSE CT)07/16/2017 [facility] Result Impression   1.Hypermetabolic left level III cervical lymph node consistent with metastatic disease.  2.Additional nonhypermetabolic subcentimeter left level V cervical lymph nodes are also noted. They are of questionable significance  3.Focal area of increased uptake in the posterior left liver, of questionable significance. Recommend MRI of the liver for further evaluation. 4.Apparent bladder wall thickening which may in part be due to underdistention. Consider urinalysis and urology consult if clinically indicated. 5.Ancillary CT findings as above.    PET 01/14/17 IMPRESSION: 1. Focal area of FDG uptake in the left lateral pharynx/upper left tonsillar region likely representing the primary neoplasm. 2. Metastatic hypermetabolic left level 2 neck nodes. No other FDG positive nodal stations or contralateral adenopathy. 3. No findings for metastatic disease.   CT Soft Tissue W Contrast 01/03/17 IMPRESSION: 1. Abnormally enlarged, solid left level 2 lymph node up to 4.3 cm corresponds to the palpable area of concern. Other left level 2 and level 3 nodes are normal by size criteria but asymmetrically increased. No contralateral lymphadenopathy. No primary tumor identified. 2. B[place]is Junes this is metastatic nodal disease from an occult pharyngeal/laryngeal primary. Less likely differential considerations are lymphoproliferative disorder, lymphoma, or metastatic disease from a regional skin cancer or a distant primary. 3. Tiny right apical lung nodules appear postinflammatory.   ASSESSMENT & PLAN:   Tracy [name] is a 67 y.o. 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 Only 10pk-yr h/o smoking and quit in  1982 patient is s/p TORS surgery to confirm that the left tonsils/BOT area is the primary lesion as suggested by PET/CT . -he has subsequently had radical lymph node dissection of his neck which shows 1 out of 21 lymph nodes with metastatic disease 5.1 cm without ENE. --patient had rpt PET/CT on 07/16/2017 which showed  hypermetabolic left level III cervical lymph node consistent with metastatic disease. Additional nonhypermetabolic  subcentimeter left level V cervical lymph nodes are also noted. They are of questionable significance. Focal area of increased uptake in the posterior left liver, of questionable significance - on 08/15/2017 by Dr [place] Canal yielded metastatic squamous cell carcinoma in 1 out of 8 lymph nodes. -Asked pt to let me know if throat becomes painful, we would start with magic mouthwash.back sooner  Completed adjuvant RT  PLAN  -Discussed pt labwork today, 11/17/17; blood counts and chemistries are stable.  -Pt has completed adjuvant IMRT with Dr Isidore Moos -Discussed glucose at 300, pt has a plan of follow up with his PCP regarding his diabetes management -Will see pt back in 2-3 months with PET/CT -If pt develops any swallowing concerns or ability to maintain nutritional status, will see pt   #3 Diabetes Plan -recommended drinking atleast 60OZ of water daily -close f/u with PCP to optimize DM2 management. Will likely need something more than metformin to control his blood sugars.   PET/CT in 11 weeks RTC with Dr Irene Limbo in 12 weeks with PET and Labs     All of the patients questions were answered with apparent satisfaction. The patient knows to call the clinic with any problems, questions or concerns.  The toal time spent in the appt was 20 minutes and more than 50% was on counseling and direct patient cares.     Sullivan Lone MD MS AAHIVMS [facility] Hematology/Oncology Physician [facility]  (Office):       [phone] (Work cell):  [phone] (Fax):            [phone]  11/17/2017 2:01 PM  I, Baldwin [place], am acting as a Education administrator for Dr Irene Limbo.   .I have reviewed the above documentation for accuracy and completeness, and I agree with the above. Brunetta Genera MD

## 2017-11-17 NOTE — Telephone Encounter (Signed)
Scheduled appt per 6/17 los - gave patient aVS and calender per los.  

## 2017-11-19 ENCOUNTER — Ambulatory Visit
Admission: RE | Admit: 2017-11-19 | Discharge: 2017-11-19 | Disposition: A | Discharge status: Home / Self Care | Payer: Medicare Other | Source: Ambulatory Visit | Attending: Radiation Oncology | Admitting: Radiation Oncology

## 2017-11-19 ENCOUNTER — Other Ambulatory Visit: Payer: Self-pay

## 2017-11-19 ENCOUNTER — Encounter: Payer: Self-pay | Admitting: Radiation Oncology

## 2017-11-19 VITALS — BP 115/48 | HR 77 | Temp 97.8°F | Resp 18 | Wt 187.6 lb

## 2017-11-19 DIAGNOSIS — Z1329 Encounter for screening for other suspected endocrine disorder: Secondary | ICD-10-CM

## 2017-11-19 DIAGNOSIS — C090 Malignant neoplasm of tonsillar fossa: Secondary | ICD-10-CM | POA: Insufficient documentation

## 2017-11-19 DIAGNOSIS — D229 Melanocytic nevi, unspecified: Secondary | ICD-10-CM

## 2017-11-19 DIAGNOSIS — Z794 Long term (current) use of insulin: Secondary | ICD-10-CM | POA: Diagnosis not present

## 2017-11-19 DIAGNOSIS — Z923 Personal history of irradiation: Secondary | ICD-10-CM | POA: Insufficient documentation

## 2017-11-19 HISTORY — DX: Personal history of irradiation: Z92.3

## 2017-11-19 NOTE — Progress Notes (Signed)
Radiation Oncology         [phone] ________________________________  Name: Tracy [name] MRN: [id number]  Date: 11/19/2017  DOB: 06-Mar-1951  Follow-Up Visit Note  CC: [name], MD  [name], *  Diagnosis and Prior Radiotherapy:       ICD-10-CM   1. Cancer of tonsillar fossa ([place]) C09.0     CHIEF COMPLAINT:  Here for follow-up and surveillance of tonsillar fossa cancer  Narrative:  The patient returns today for routine follow-up after completing radiation therapy on 11/05/2017. He is accompanied by his wife and daughter. He notes issues with his skin, including concern with several moles.  He is a Building control surveyor, and raises concerns about when he is able to go back to work and if his work will affect his neck skin.   Dr. Irene Limbo has ordered a PET scan.  ALLERGIES:  is allergic to bismuth subsalicylate.  Meds: Current Outpatient Medications  Medication Sig Dispense Refill  . insulin NPH-regular Human (NOVOLIN 70/30) (70-30) 100 UNIT/ML injection Inject 32 units subcutaneously every morning and inject 30 units every evening    . timolol (TIMOPTIC) 0.5 % ophthalmic solution      No current facility-administered medications for this encounter.     Physical Findings: The patient is in no acute distress. Patient is alert and oriented. Wt Readings from Last 3 Encounters:  11/19/17 187 lb 9.6 oz (85.1 kg)  11/17/17 186 lb 6.4 oz (84.6 kg)  11/04/17 188 lb 9.6 oz (85.5 kg)    weight is 187 lb 9.6 oz (85.1 kg). His temperature is 97.8 F (36.6 C). His blood pressure is 115/48 (abnormal) and his pulse is 77. His respiration is 18 and oxygen saturation is 98%.  HEENT: Head is normocephalic. Extraocular movements are intact. Oropharynx is notable for minimal mucositis. Neck: Neck is notable for no palpable masses. A little thickening around scar, which is post-operative. Skin: Skin in treatment fields shows satisfactory healing. Skin is still dry on left neck.  Heart:  Regular in rate and rhythm with no murmurs, rubs, or gallops. Chest: Clear to auscultation bilaterally, with no rhonchi, wheezes, or rales.  ECOG 0  Lab Findings: Lab Results  Component Value Date   WBC 4.2 11/17/2017   HGB 14.8 11/17/2017   HCT 43.1 11/17/2017   MCV 92.1 11/17/2017   PLT 205 11/17/2017    Lab Results  Component Value Date   TSH 1.341 09/02/2017    Radiographic Findings: No results found.  Impression/Plan:    1) Head and Neck Cancer Status: radiation complete - healing very well  2) Nutritional Status: no issues PEG tube: n/a  3) Swallowing: no issues  4) Dental: Encouraged to continue regular followup with dentistry, and dental hygiene including fluoride rinses.   5) Thyroid function:  WNL - recheck in Sept Lab Results  Component Value Date   TSH 1.341 09/02/2017    6) Other: I recommend he see a dermatologist to get a full skin exam at least once a year.  Will refer to be seen in 2-3 mo to address moles that concern patient.  7) Follow-up in 3 months after restaging scans. He has an appointment with Dr. Irene Limbo on 02/09/2018. The patient was encouraged to call with any issues or questions before then.  I spent 20 minutes  face to face with the patient and more than 50% of that time was spent in counseling and/or coordination of care. _____________________________________   [name], MD  This document  serves as a record of services personally performed by [name], MD. It was created on his behalf by Wilburn Mylar, a trained medical scribe. The creation of this record is based on the scribe's personal observations and the provider's statements to them. This document has been checked and approved by the attending provider.

## 2017-11-25 ENCOUNTER — Telehealth: Payer: Self-pay | Admitting: Hematology

## 2017-11-25 NOTE — Telephone Encounter (Signed)
Faxed medical record to [facility] @ [phone]. Release CH#[id number]

## 2017-12-24 ENCOUNTER — Encounter (HOSPITAL_COMMUNITY): Payer: Self-pay | Admitting: Dentistry

## 2017-12-24 ENCOUNTER — Ambulatory Visit (HOSPITAL_COMMUNITY): Payer: Medicaid - Dental | Admitting: Dentistry

## 2017-12-24 VITALS — BP 119/82 | HR 76 | Temp 98.7°F | Wt 186.0 lb

## 2017-12-24 DIAGNOSIS — C090 Malignant neoplasm of tonsillar fossa: Secondary | ICD-10-CM

## 2017-12-24 DIAGNOSIS — K117 Disturbances of salivary secretion: Secondary | ICD-10-CM | POA: Diagnosis not present

## 2017-12-24 DIAGNOSIS — Z923 Personal history of irradiation: Secondary | ICD-10-CM

## 2017-12-24 DIAGNOSIS — R432 Parageusia: Secondary | ICD-10-CM | POA: Diagnosis not present

## 2017-12-24 DIAGNOSIS — Z9221 Personal history of antineoplastic chemotherapy: Secondary | ICD-10-CM | POA: Diagnosis not present

## 2017-12-24 NOTE — Progress Notes (Signed)
12/24/2017  Patient Name:   Tracy [name] Date of Birth:   06-06-1950 Medical Record Number: [id number]  BP 119/82 (BP Location: Left Arm)   Pulse 76   Temp 98.7 F (37.1 C)   Wt 186 lb (84.4 kg)   BMI 27.47 kg/m   Dereck Leep presents for oral examination after radiation therapy. Patient has completed radiation treatments from 09/24/2017 through 11/05/2017. Patient indicates that he saw his primary dentist, Dr. Valetta Mole, approximately 2 weeks ago for an exam and cleaning. Patient is seen on an every 3 month recall basis.  REVIEW OF CHIEF COMPLAINTS:  DRY MOUTH: Yes. HARD TO SWALLOW:  No problems with swallowing  HURT TO SWALLOW:   No pain. TASTE CHANGES: the taste is slowly returning SORES IN MOUTH: No TRISMUS: no problems with trismus. Performing trismus exercises daily. WEIGHT: 186 down from initial 196 pounds.  HOME OH REGIMEN:  BRUSHING: Twice a day FLOSSING: Uses WaterPik RINSING: Uses Biotene rinses as needed but especially prior to sleep at bedtime. FLUORIDE: Uses his fluoride at 30 minutes before bedtime TRISMUS EXERCISES:  Maximum interincisal opening: 38 mm    DENTAL EXAM:  Oral Hygiene:(PLAQUE): good oral hygiene LOCATION OF MUCOSITIS: none noted DESCRIPTION OF SALIVA: decreased and foamy saliva ANY EXPOSED BONE: none noted OTHER WATCHED AREAS: impacted wisdom teeth numbers 1 and 16. DX: Xerostomia and Dysgeusia  RECOMMENDATIONS: 1. Brush after meals and at bedtime.  Use fluoride at bedtime. 2. Use trismus exercises as directed. 3. Use Biotene Rinse or salt water/baking soda rinses. 4. Multiple sips of water as needed. 5. Return to Dr. Valetta Mole for periodic oral examination and cleaning.  Lenn Cal, DDS

## 2017-12-24 NOTE — Patient Instructions (Signed)
RECOMMENDATIONS: 1. Brush after meals and at bedtime.  Use fluoride at bedtime. 2. Use trismus exercises as directed. 3. Use Biotene Rinse or salt water/baking soda rinses. 4. Multiple sips of water as needed. 5. Return to Dr. Valetta Mole for periodic oral examination and cleaning.  Lenn Cal, DDS

## 2018-01-07 DIAGNOSIS — E11649 Type 2 diabetes mellitus with hypoglycemia without coma: Secondary | ICD-10-CM | POA: Diagnosis not present

## 2018-01-19 ENCOUNTER — Ambulatory Visit: Payer: Medicare Other | Attending: Radiation Oncology

## 2018-01-19 DIAGNOSIS — R1310 Dysphagia, unspecified: Secondary | ICD-10-CM | POA: Insufficient documentation

## 2018-01-19 NOTE — Therapy (Signed)
[facility] [address] [place], [place], [address] Phone: [phone]   Fax:  [phone]  Speech Language Pathology Treatment/ Renewal-Discharge  Patient Details  Name: Tracy [name] MRN: [id number] Date of Birth: 09/05/1950 Referring Provider: [name] MD   Encounter Date: 01/19/2018  End of Session - 01/19/18 1229    Visit Number  4    Number of Visits  4    Date for SLP Re-Evaluation  01-19-18    SLP Start Time  1108    SLP Stop Time   6962    SLP Time Calculation (min)  37 min       Past Medical History:  Diagnosis Date  . Diabetes mellitus ([place])   . Glaucoma   . History of radiation therapy 09/24/17- 11/05/17   head and neck, Left tonsil/ 60 gy delivered in 30 fractions of 2 Gy.   . Kidney stones 1985  . Neck mass 12/24/2016    Past Surgical History:  Procedure Laterality Date  . 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 FINE NEEDLE ASPIRATION  12/25/2016   Left neck, Level 2  . NASAL SEPTUM SURGERY     remotely  . TOE SURGERY     broken toe with bone ground down.     There were no vitals filed for this visit.  Subjective Assessment - 01/19/18 1121    Subjective  Taste aversions persist after getting slightly better for a bit. Pt states is has been a couple weeks since he did HEP regularly due to blood sugars being off.    Currently in Pain?  No/denies            ADULT SLP TREATMENT - 01/19/18 1123      General Information   Behavior/Cognition  Alert;Cooperative;Pleasant mood      Treatment Provided   Treatment provided  Dysphagia      Dysphagia Treatment   Temperature Spikes Noted  No    Respiratory Status  Room air    Treatment Methods  Skilled observation    Patient observed directly with PO's  Yes    Type of PO's observed  Dysphagia 3 (soft);Thin liquids    Liquids provided via  Cup    Oral Phase Signs & Symptoms  --   none noted   Pharyngeal Phase Signs &  Symptoms  --   none noted   Other treatment/comments  "I've not had any difficulty with anything." Pt strongly tells SLP more of his difficulties are with TASTE and not with clearance. No overt s/s aspiration with [place] bites x2 and with water sips x7. (throughout session and with PO solids). Pt met all LTGs today.      Assessment / Recommendations / Plan   Plan  Discharge SLP treatment due to (comment)   goals met, pt demo'd safe swallow, indpendent with HEP     Dysphagia Recommendations   Diet recommendations  Thin liquid   diet as tolerated   Liquids provided via  Cup    Medication Administration  Whole meds with liquid      Progression Toward Goals   Progression toward goals  Goals met, education completed, patient discharged from [facility] - 01/19/18 1228    Education provided  Yes    Education Details  what to do in case of overt s/s aspiration or aspiration PNA    Person(s) Educated  Patient  Methods  Explanation    Comprehension  Verbalized understanding;Returned demonstration       SLP Short Term Goals - 10/20/17 1040      SLP SHORT TERM GOAL #1   Title  pt will complete HEP with occasional min A     Time  --    Period  --    Status  Achieved      SLP SHORT TERM GOAL #2   Title  pt will tell SLP why he is completing HEP     Time  --    Period  --    Status  Achieved       SLP Long Term Goals - 01/19/18 1138      SLP LONG TERM GOAL #1   Title  pt will complete HEP with modified independence over a two month break between sessions    Status  Achieved      SLP LONG TERM GOAL #2   Title  pt will tell SLP when HEP frequency can be reduced to x2-3/week    Status  Achieved      SLP LONG TERM GOAL #3   Title  pt will tell SLP 3 overt s/s of aspiration PNA with modified independence    Status  Achieved      SLP LONG TERM GOAL #4   Title  pt will tell SLP how a food journal can assist with return to pt's safest diet    Status  Achieved        Plan - 01/19/18 1229    Clinical Impression Statement  Pt with oropharyngeal swallowing essentially WNL for Dys III and water. Pt appears safe at this time with at dys III solids and thin liquid, with taste aversions persisting. Pt did  ont require assistance with HEP today. Met all LTGs. He will be d/c'd after his one visit today due to eating safely with POs and continuing to perform HEP with excellent success.    Speech Therapy Frequency  --   today only   Treatment/Interventions  Aspiration precaution training;Pharyngeal strengthening exercises;Diet toleration management by SLP;Trials of upgraded texture/liquids;Internal/external aids;Patient/family education;Compensatory strategies;SLP instruction and feedback;Cueing hierarchy;Environmental controls    Potential to Achieve Goals  Good    SLP Home Exercise Plan  provided today    Consulted and Agree with Plan of Care  Patient       Patient will benefit from skilled therapeutic intervention in order to improve the following deficits and impairments:   Dysphagia, unspecified type   SPEECH THERAPY RENEWAL/ISCHARGE SUMMARY  Visits from Start of Care: 4  Current functional level related to goals / functional outcomes: Pt has met all LTGs. See goal summary above. Pt has remained without overt s/s aspiration with consistencies tested today, and is without overt s/s aspiration PNA.   Remaining deficits: None re: swallowing. Pt has taste aversions.   Education / Equipment: HEP procedure, late effects head/neck radiation on swallowing  Plan: Patient agrees to discharge.  Patient goals were met. Patient is being discharged due to meeting the stated rehab goals.  ?????       Problem List Patient Active Problem List   Diagnosis Date Noted  . Cancer of tonsillar fossa [facility]) 01/29/2017    [facility] 01/19/2018, 12:32 PM  [facility] [address] [place], [place],  [address] Phone: [phone]   Fax:  [phone]   Name: Tracy [name] MRN: [id number] Date of Birth: June 05, 1950

## 2018-01-19 NOTE — Addendum Note (Signed)
Addended by: Garald Balding B on: 01/19/2018 12:36 PM   Modules accepted: Orders

## 2018-01-19 NOTE — Patient Instructions (Signed)
With increased coughing, and/or wet voice, and/or throat clearing during meals or with more frequent difficulty clearing food from your throat, consider contacting your primary care physician, Dr. Isidore Moos, or Dr. Constance Holster.

## 2018-01-30 ENCOUNTER — Other Ambulatory Visit: Payer: Self-pay

## 2018-01-30 DIAGNOSIS — C760 Malignant neoplasm of head, face and neck: Secondary | ICD-10-CM

## 2018-01-30 NOTE — Progress Notes (Signed)
Mr Tietze presents for follow up of radiation completed 11/05/17 to his tonsil.   Pain issues, if any: He denies pain Using a feeding tube?: N/A Weight changes, if any:  Wt Readings from Last 3 Encounters:  02/09/18 175 lb 12.8 oz (79.7 kg)  12/24/17 186 lb (84.4 kg)  11/19/17 187 lb 9.6 oz (85.1 kg)   Swallowing issues, if any: He is not eating bread. He is eating softer foods that are moistened and liquid type foods like soups. He is drinking ensure 2-3 times daily.  Smoking or chewing tobacco? No Using fluoride trays daily? He is using the trays most nights. Last ENT visit was on: Not since diagnosis.  Other notable issues, if any:  He has an apt with Dr. Irene Limbo today after this follow up.  02/03/18 PET  He saw Glendell Docker on 01/19/18 and Dr. Enrique Sack on 12/24/17  BP 116/75 (BP Location: Right Arm)   Pulse 64   Temp 98.2 F (36.8 C) (Oral)   Ht 5\' 9"  (1.753 m)   Wt 175 lb 12.8 oz (79.7 kg)   SpO2 98%   BMI 25.96 kg/m

## 2018-02-03 ENCOUNTER — Ambulatory Visit (HOSPITAL_COMMUNITY): Payer: Medicare Other

## 2018-02-03 ENCOUNTER — Inpatient Hospital Stay: Payer: Medicare Other | Attending: Radiation Oncology

## 2018-02-03 DIAGNOSIS — C760 Malignant neoplasm of head, face and neck: Secondary | ICD-10-CM

## 2018-02-03 DIAGNOSIS — Z79899 Other long term (current) drug therapy: Secondary | ICD-10-CM | POA: Diagnosis not present

## 2018-02-03 DIAGNOSIS — C770 Secondary and unspecified malignant neoplasm of lymph nodes of head, face and neck: Secondary | ICD-10-CM | POA: Insufficient documentation

## 2018-02-03 DIAGNOSIS — Z794 Long term (current) use of insulin: Secondary | ICD-10-CM | POA: Diagnosis not present

## 2018-02-03 DIAGNOSIS — C099 Malignant neoplasm of tonsil, unspecified: Secondary | ICD-10-CM | POA: Diagnosis not present

## 2018-02-03 DIAGNOSIS — Z87891 Personal history of nicotine dependence: Secondary | ICD-10-CM | POA: Diagnosis not present

## 2018-02-03 DIAGNOSIS — E119 Type 2 diabetes mellitus without complications: Secondary | ICD-10-CM | POA: Insufficient documentation

## 2018-02-03 DIAGNOSIS — Z1329 Encounter for screening for other suspected endocrine disorder: Secondary | ICD-10-CM

## 2018-02-03 LAB — CBC WITH DIFFERENTIAL (CANCER CENTER ONLY)
BASOS ABS: 0 10*3/uL (ref 0.0–0.1)
BASOS PCT: 0 %
EOS ABS: 0.3 10*3/uL (ref 0.0–0.5)
Eosinophils Relative: 7 %
HCT: 45 % (ref 38.4–49.9)
HEMOGLOBIN: 15.3 g/dL (ref 13.0–17.1)
Lymphocytes Relative: 22 %
Lymphs Abs: 0.9 10*3/uL (ref 0.9–3.3)
MCH: 32 pg (ref 27.2–33.4)
MCHC: 34 g/dL (ref 32.0–36.0)
MCV: 94.1 fL (ref 79.3–98.0)
MONOS PCT: 9 %
Monocytes Absolute: 0.4 10*3/uL (ref 0.1–0.9)
NEUTROS PCT: 62 %
Neutro Abs: 2.6 10*3/uL (ref 1.5–6.5)
Platelet Count: 214 10*3/uL (ref 140–400)
RBC: 4.78 MIL/uL (ref 4.20–5.82)
RDW: 12.5 % (ref 11.0–14.6)
WBC: 4.2 10*3/uL (ref 4.0–10.3)

## 2018-02-03 LAB — CMP (CANCER CENTER ONLY)
ALK PHOS: 72 U/L (ref 38–126)
ALT: 21 U/L (ref 0–44)
AST: 22 U/L (ref 15–41)
Albumin: 3.9 g/dL (ref 3.5–5.0)
Anion gap: 8 (ref 5–15)
BILIRUBIN TOTAL: 0.4 mg/dL (ref 0.3–1.2)
BUN: 12 mg/dL (ref 8–23)
CALCIUM: 9.5 mg/dL (ref 8.9–10.3)
CO2: 26 mmol/L (ref 22–32)
Chloride: 105 mmol/L (ref 98–111)
Creatinine: 1.13 mg/dL (ref 0.61–1.24)
Glucose, Bld: 114 mg/dL — ABNORMAL HIGH (ref 70–99)
Potassium: 4.5 mmol/L (ref 3.5–5.1)
Sodium: 139 mmol/L (ref 135–145)
TOTAL PROTEIN: 7.3 g/dL (ref 6.5–8.1)

## 2018-02-03 LAB — RETICULOCYTES
RBC.: 4.78 MIL/uL (ref 4.20–5.82)
Retic Count, Absolute: 52.6 10*3/uL (ref 34.8–93.9)
Retic Ct Pct: 1.1 % (ref 0.8–1.8)

## 2018-02-05 ENCOUNTER — Ambulatory Visit (HOSPITAL_COMMUNITY)
Admission: RE | Admit: 2018-02-05 | Discharge: 2018-02-05 | Disposition: A | Discharge status: Home / Self Care | Payer: Medicare Other | Source: Ambulatory Visit | Attending: Hematology | Admitting: Hematology

## 2018-02-05 DIAGNOSIS — C090 Malignant neoplasm of tonsillar fossa: Secondary | ICD-10-CM | POA: Diagnosis not present

## 2018-02-05 DIAGNOSIS — R911 Solitary pulmonary nodule: Secondary | ICD-10-CM | POA: Diagnosis not present

## 2018-02-05 DIAGNOSIS — C760 Malignant neoplasm of head, face and neck: Secondary | ICD-10-CM

## 2018-02-05 DIAGNOSIS — Z923 Personal history of irradiation: Secondary | ICD-10-CM | POA: Diagnosis not present

## 2018-02-05 LAB — GLUCOSE, CAPILLARY: Glucose-Capillary: 137 mg/dL — ABNORMAL HIGH (ref 70–99)

## 2018-02-05 MED ORDER — FLUDEOXYGLUCOSE F - 18 (FDG) INJECTION
9.1100 | Freq: Once | INTRAVENOUS | Status: AC
  Administered 2018-02-05: 9.11 via INTRAVENOUS

## 2018-02-06 ENCOUNTER — Telehealth: Payer: Self-pay | Admitting: *Deleted

## 2018-02-06 NOTE — Telephone Encounter (Signed)
CALLED PATIENT TO REMIND OF LAB ON 02-09-18 @ 1:30 PM AND HIS FU ON 02-09-18 @ 2 PM, LVM FOR A RETURN CALL

## 2018-02-09 ENCOUNTER — Inpatient Hospital Stay (HOSPITAL_BASED_OUTPATIENT_CLINIC_OR_DEPARTMENT_OTHER): Payer: Medicare Other | Admitting: Hematology

## 2018-02-09 ENCOUNTER — Ambulatory Visit
Admission: RE | Admit: 2018-02-09 | Discharge: 2018-02-09 | Disposition: A | Discharge status: Home / Self Care | Payer: Medicare Other | Source: Ambulatory Visit | Attending: Radiation Oncology | Admitting: Radiation Oncology

## 2018-02-09 ENCOUNTER — Encounter: Payer: Self-pay | Admitting: Radiation Oncology

## 2018-02-09 ENCOUNTER — Other Ambulatory Visit: Payer: Self-pay

## 2018-02-09 ENCOUNTER — Telehealth: Payer: Self-pay | Admitting: Hematology

## 2018-02-09 VITALS — BP 116/75 | HR 64 | Temp 98.2°F | Ht 69.0 in | Wt 175.8 lb

## 2018-02-09 VITALS — BP 116/75 | HR 64 | Temp 98.2°F | Resp 18 | Ht 69.0 in | Wt 175.8 lb

## 2018-02-09 DIAGNOSIS — R911 Solitary pulmonary nodule: Secondary | ICD-10-CM

## 2018-02-09 DIAGNOSIS — C760 Malignant neoplasm of head, face and neck: Secondary | ICD-10-CM

## 2018-02-09 DIAGNOSIS — Z794 Long term (current) use of insulin: Secondary | ICD-10-CM | POA: Diagnosis not present

## 2018-02-09 DIAGNOSIS — E119 Type 2 diabetes mellitus without complications: Secondary | ICD-10-CM

## 2018-02-09 DIAGNOSIS — C090 Malignant neoplasm of tonsillar fossa: Secondary | ICD-10-CM | POA: Diagnosis not present

## 2018-02-09 DIAGNOSIS — Z79899 Other long term (current) drug therapy: Secondary | ICD-10-CM | POA: Diagnosis not present

## 2018-02-09 DIAGNOSIS — Z85818 Personal history of malignant neoplasm of other sites of lip, oral cavity, and pharynx: Secondary | ICD-10-CM | POA: Diagnosis not present

## 2018-02-09 DIAGNOSIS — R918 Other nonspecific abnormal finding of lung field: Secondary | ICD-10-CM

## 2018-02-09 DIAGNOSIS — Z87891 Personal history of nicotine dependence: Secondary | ICD-10-CM | POA: Diagnosis not present

## 2018-02-09 DIAGNOSIS — C099 Malignant neoplasm of tonsil, unspecified: Secondary | ICD-10-CM | POA: Diagnosis not present

## 2018-02-09 DIAGNOSIS — C770 Secondary and unspecified malignant neoplasm of lymph nodes of head, face and neck: Secondary | ICD-10-CM | POA: Diagnosis not present

## 2018-02-09 DIAGNOSIS — E162 Hypoglycemia, unspecified: Secondary | ICD-10-CM | POA: Diagnosis not present

## 2018-02-09 DIAGNOSIS — Z923 Personal history of irradiation: Secondary | ICD-10-CM | POA: Insufficient documentation

## 2018-02-09 DIAGNOSIS — Z08 Encounter for follow-up examination after completed treatment for malignant neoplasm: Secondary | ICD-10-CM | POA: Diagnosis not present

## 2018-02-09 LAB — CBC WITH DIFFERENTIAL/PLATELET
Basophils Absolute: 0 10*3/uL (ref 0.0–0.1)
Basophils Relative: 0 %
Eosinophils Absolute: 0.2 10*3/uL (ref 0.0–0.5)
Eosinophils Relative: 5 %
HEMATOCRIT: 42.9 % (ref 38.4–49.9)
HEMOGLOBIN: 15 g/dL (ref 13.0–17.1)
LYMPHS ABS: 0.9 10*3/uL (ref 0.9–3.3)
LYMPHS PCT: 23 %
MCH: 32.4 pg (ref 27.2–33.4)
MCHC: 35 g/dL (ref 32.0–36.0)
MCV: 92.7 fL (ref 79.3–98.0)
MONOS PCT: 10 %
Monocytes Absolute: 0.4 10*3/uL (ref 0.1–0.9)
NEUTROS PCT: 62 %
Neutro Abs: 2.6 10*3/uL (ref 1.5–6.5)
Platelets: 206 10*3/uL (ref 140–400)
RBC: 4.63 MIL/uL (ref 4.20–5.82)
RDW: 12.4 % (ref 11.0–14.6)
WBC: 4.2 10*3/uL (ref 4.0–10.3)

## 2018-02-09 LAB — CMP (CANCER CENTER ONLY)
ALT: 18 U/L (ref 0–44)
AST: 20 U/L (ref 15–41)
Albumin: 3.7 g/dL (ref 3.5–5.0)
Alkaline Phosphatase: 69 U/L (ref 38–126)
Anion gap: 7 (ref 5–15)
BUN: 13 mg/dL (ref 8–23)
CHLORIDE: 106 mmol/L (ref 98–111)
CO2: 27 mmol/L (ref 22–32)
Calcium: 9.4 mg/dL (ref 8.9–10.3)
Creatinine: 1.05 mg/dL (ref 0.61–1.24)
Glucose, Bld: 61 mg/dL — ABNORMAL LOW (ref 70–99)
POTASSIUM: 4 mmol/L (ref 3.5–5.1)
SODIUM: 140 mmol/L (ref 135–145)
Total Bilirubin: 0.4 mg/dL (ref 0.3–1.2)
Total Protein: 7.3 g/dL (ref 6.5–8.1)

## 2018-02-09 NOTE — Telephone Encounter (Signed)
Gave pt avs and calendar  °

## 2018-02-09 NOTE — Progress Notes (Signed)
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 male who has been referred to [place] by Dr. [name], his ENT, for evaluation and management of Squamous Cell Carcinoma of the head and Neck. The patient's last visit with [place] was on 11/17/17. He is accompanied today by his wife and daughter. The pt reports that he is doing well overall.   The pt reports that he had a spell of confusion in the interim due to very low blood sugars and was readjusted on these by his PCP, however he notes that he has continued to have low blood sugars. The pt notes that he is unable to tell when his blood sugars are low.   The pt also notes that he has continued to have some dry mouth and has been using biotene mouthwash at night without much relief. He notes that he has not developed any constitutional symptoms. He notes that he hasn't felt depressed and continues find joy in life. He has continued to work and is staying active.   Of note since the patient's last visit, pt has had PET/CT completed on 02/05/18 with results revealing Resolution hypermetabolic LEFT level 2 lymph node. No 2 no hypermetabolic lymph nodes in the neck. 2. No residual hypermetabolic activity in the hypopharynx. 3. Single new RIGHT upper lobe pulmonary nodule with mild metabolic activity is indeterminate. Recommend short-term CT follow-up to demonstrate stability/resolution or growth.  Lab results today (02/09/18) of CBC w/diff, CMP, and Reticulocytes is as  follows: all values are WNL except for Glucose at 61.  On review of systems, pt reports stable weight, eating well, staying active, dry mouth, and denies fevers, chills, night sweats, abdominal pains, difficulty breathing, and any other symptoms.   MEDICAL HISTORY:  Past Medical History:  Diagnosis Date  . Diabetes mellitus ([place])   . Glaucoma   . History of radiation therapy 09/24/17- 11/05/17   head and neck, Left tonsil/ 60 gy delivered in 30 fractions of 2 Gy.   . Kidney stones 1985  . Neck mass 12/24/2016    SURGICAL HISTORY: Past Surgical History:  Procedure Laterality Date  . 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 FINE NEEDLE ASPIRATION  12/25/2016   Left neck, Level 2  . NASAL SEPTUM SURGERY     remotely  . TOE SURGERY     broken toe with bone ground down.     SOCIAL HISTORY: Social History   Socioeconomic History  . Marital status: Married    Spouse name: Not on file  . Number of children: Not on file  . Years of education: Not on file  . Highest education level: Not on file  Occupational History  . Not on file  Social Needs  . Financial resource strain: Not on file  . Food insecurity:    Worry: Not on file    Inability: Not on file  . Transportation needs:    Medical: Not on file    Non-medical: Not on file  Tobacco Use  . Smoking  status: Former Smoker    Packs/day: 1.00    Years: 10.00    Pack years: 10.00    Types: Cigarettes    Last attempt to quit: 06/03/1985    Years since quitting: 32.7  . Smokeless tobacco: Former Systems developer    Types: [place] date: 06/03/1985  Substance and Sexual Activity  . Alcohol use: No  . Drug use: No  . Sexual activity: Not on file  Lifestyle  . Physical activity:    Days per week: Not on file    Minutes per session: Not on file  . Stress: Not on file  Relationships  . Social connections:    Talks on phone: Not on file    Gets together: Not on file    Attends religious service: Not on file    Active member  of club or organization: Not on file    Attends meetings of clubs or organizations: Not on file    Relationship status: Not on file  . Intimate partner violence:    Fear of current or ex partner: No    Emotionally abused: No    Physically abused: No    Forced sexual activity: No  Other Topics Concern  . Not on file  Social History Narrative  . Not on file    FAMILY HISTORY: His mother had mucosal melanoma and father had prostate cancer. His sister had stage 4 pancreatic cancer.  His mother and father had DM.  His maternal grandfather had leukemia.  His maternal uncle had brain cancer.   ALLERGIES:  is allergic to bismuth subsalicylate.  MEDICATIONS:  Current Outpatient Medications  Medication Sig Dispense Refill  . atorvastatin (LIPITOR) 10 MG tablet TAKE 1 TABLET BY MOUTH ONCE A WEEK FOR HIGH CHOLESTEROL  3  . insulin NPH-regular Human (NOVOLIN 70/30) (70-30) 100 UNIT/ML injection 20 Units.     . timolol (TIMOPTIC) 0.5 % ophthalmic solution      No current facility-administered medications for this visit.     REVIEW OF SYSTEMS:    A 10+ POINT REVIEW OF SYSTEMS WAS OBTAINED including neurology, dermatology, psychiatry, cardiac, respiratory, lymph, extremities, GI, GU, Musculoskeletal, constitutional, breasts, reproductive, HEENT.  All pertinent positives are noted in the HPI.  All others are negative.   PHYSICAL EXAMINATION: ECOG PERFORMANCE STATUS: 1 - Symptomatic but completely ambulatory  .VS reviewed in [place], in no acute distress and comfortable SKIN: no acute rashes, no significant lesions EYES: conjunctiva are pink and non-injected, sclera anicteric OROPHARYNX: MMM, no exudates, no oropharyngeal erythema or ulceration NECK: supple, no JVD LYMPH:  no palpable lymphadenopathy in the cervical, axillary or inguinal regions LUNGS: clear to auscultation b/l with normal respiratory effort HEART: regular rate & rhythm ABDOMEN:  normoactive bowel sounds ,  non tender, not distended. No palpable hepatosplenomegaly.  Extremity: no pedal edema PSYCH: alert & oriented x 3 with fluent speech NEURO: no focal motor/sensory deficits   LABORATORY DATA:  I have reviewed the data as listed  . CBC Latest Ref Rng & Units 02/09/2018 02/03/2018 11/17/2017  WBC 4.0 - 10.3 K/uL 4.2 4.2 4.2  Hemoglobin 13.0 - 17.1 g/dL 15.0 15.3 14.8  Hematocrit 38.4 - 49.9 % 42.9 45.0 43.1  Platelets 140 - 400 K/uL 206 214 205    . CMP Latest Ref Rng & Units 02/09/2018 02/03/2018 11/17/2017  Glucose 70 - 99 mg/dL 61(L) 114(H) 300(H)  BUN 8 - 23 mg/dL 13 12 20   Creatinine 0.61 - 1.24 mg/dL 1.05 1.13  1.27  Sodium 135 - 145 mmol/L 140 139 136  Potassium 3.5 - 5.1 mmol/L 4.0 4.5 4.3  Chloride 98 - 111 mmol/L 106 105 100  CO2 22 - 32 mmol/L 27 26 29   Calcium 8.9 - 10.3 mg/dL 9.4 9.5 9.4  Total Protein 6.5 - 8.1 g/dL 7.3 7.3 7.0  Total Bilirubin 0.3 - 1.2 mg/dL 0.4 0.4 0.3  Alkaline Phos 38 - 126 U/L 69 72 80  AST 15 - 41 U/L 20 22 23   ALT 0 - 44 U/L 18 21 23      PATHOLOGY   Diagnosis 12/25/16   CONSULT SLIDE, LEFT NECK LEVEL 2 MALIGNANT CELLS PRESENT, CONSISTENT WITH SQUAMOUS CELL CARCINOMA. 04/16/2017  03/17/2017  02/20/2017   Surgical Pathology Tissue ExamResulted: 08/19/2017 4:44 PM [facility] Specimen Collected on  Lymph Node 08/15/2017 9:02 PM  Result Narrative    ACCESSION DXIPJA:[id number] RECEIVED: 08/15/2017 ORDERING PHYSICIAN:CHRISTOPHER A SULLIVAN , MD PATIENT NAME:[place], Hosea STARLEY SURGICAL PATHOLOGY REPORT  FINAL PATHOLOGIC DIAGNOSIS MICROSCOPIC EXAMINATION AND DIAGNOSIS   "LEFT LEVEL 3&5 LYMPH NODE", EXCISON: Metastatic squamous cell carcinoma involving one of eight lymph nodes (1/8).    I have personally reviewed the slides and/or other related materials referenced, and have edited the report as part of my pathologic assessment and final interpretation.  Electronically Signed Out By: Sherlon Handing , MD 08/19/2017 16:44:05    RADIOGRAPHIC STUDIES: I have personally reviewed the radiological images as listed and agreed with the findings in the report.  PET/CT 07/16/2017 PET HEAD AND NECK CANCER (W/LOW DOSE CT)07/16/2017 [facility] Result Impression   1.Hypermetabolic left level III cervical lymph node consistent with metastatic disease.  2.Additional nonhypermetabolic subcentimeter left level V cervical lymph nodes are also noted. They are of questionable significance  3.Focal area of increased uptake in the posterior left liver, of questionable significance. Recommend MRI of the liver for further evaluation. 4.Apparent bladder wall thickening which may in part be due to underdistention. Consider urinalysis and urology consult if clinically indicated. 5.Ancillary CT findings as above.    PET 01/14/17 IMPRESSION: 1. Focal area of FDG uptake in the left lateral pharynx/upper left tonsillar region likely representing the primary neoplasm. 2. Metastatic hypermetabolic left level 2 neck nodes. No other FDG positive nodal stations or contralateral adenopathy. 3. No findings for metastatic disease.   CT Soft Tissue W Contrast 01/03/17 IMPRESSION: 1. Abnormally enlarged, solid left level 2 lymph node up to 4.3 cm corresponds to the palpable area of concern. Other left level 2 and level 3 nodes are normal by size criteria but asymmetrically increased. No contralateral lymphadenopathy. No primary tumor identified. 2. Burtis Junes this is metastatic nodal disease from an occult pharyngeal/laryngeal primary. Less likely differential considerations are lymphoproliferative disorder, lymphoma, or metastatic disease from a regional skin cancer or a distant primary. 3. Tiny right apical lung nodules appear postinflammatory.   ASSESSMENT & PLAN:   UKIAH TRAWICK is a 67 y.o. male with  #1  HPV Positive Squamous Cell Carcinoma of the Neck (Stage I  pTx,pN1,Mx)  Single ipsilateral LN 5.1 cms without ENE  Initial PET/CT 01/03/2017  evidence of distant metastases. Only 10pk-yr h/o smoking and quit in 1982 patient is s/p TORS surgery to confirm that the left tonsils/BOT area is the primary lesion as suggested by PET/CT . -he has subsequently had radical lymph node dissection of his neck which shows 1 out of 21 lymph nodes with metastatic disease 5.1 cm without ENE. --patient  had rpt PET/CT on 07/16/2017 which showed  hypermetabolic left level III cervical lymph node consistent with metastatic disease. Additional nonhypermetabolic subcentimeter left level V cervical lymph nodes are also noted. They are of questionable significance. Focal area of increased uptake in the posterior left liver, of questionable significance - on 08/15/2017 by Dr Conley Canal yielded metastatic squamous cell carcinoma in 1 out of 8 lymph nodes. -Completed adjuvant IMRT with Dr Consepcion Hearing  -Discussed pt labwork today, 02/09/18; blood counts and chemistries are stable -Discussed the 02/05/18 PET/CT which revealed  Resolution hypermetabolic LEFT level 2 lymph node. No 2 no hypermetabolic lymph nodes in the neck. 2. No residual hypermetabolic activity in the hypopharynx. 3. Single new RIGHT upper lobe pulmonary nodule with mild metabolic activity is indeterminate. Recommend short-term CT follow-up to demonstrate stability/resolution or growth. -Will watch lung nodule with repeat CT Chest in 3 months  -Recommended that the pt continue to eat well, drink at least 48-64 oz of water each day, and walk 20-30 minutes each day.   -Will order pilocarpine po for dry mouth to see if this is helpful  #3 Diabetes Plan -recommended drinking atleast 60OZ of water daily -close f/u with PCP to optimize DM2 management. -Recommend that PCP consider basal insulin like Lantus as opposed to NPH    Ct chest in 12 weeks RTC with Dr Irene Limbo in 3 months with labs   All of the patients questions  were answered with apparent satisfaction. The patient knows to call the clinic with any problems, questions or concerns.  The total time spent in the appt was 30 minutes and more than 50% was on counseling and direct patient cares.     Sullivan Lone MD MS AAHIVMS [facility] Hematology/Oncology Physician [facility]  (Office):       [phone] (Work cell):  [phone] (Fax):           [phone]  02/09/2018 3:37 PM  I, Baldwin [place], am acting as a scribe for Dr. Irene Limbo  .I have reviewed the above documentation for accuracy and completeness, and I agree with the above. Brunetta Genera MD

## 2018-02-10 ENCOUNTER — Other Ambulatory Visit: Payer: Self-pay | Admitting: Radiation Oncology

## 2018-02-10 ENCOUNTER — Encounter: Payer: Self-pay | Admitting: Radiation Oncology

## 2018-02-10 DIAGNOSIS — R634 Abnormal weight loss: Secondary | ICD-10-CM

## 2018-02-10 DIAGNOSIS — C090 Malignant neoplasm of tonsillar fossa: Secondary | ICD-10-CM

## 2018-02-10 NOTE — Progress Notes (Signed)
Radiation Oncology         [phone] ________________________________  Name: Tracy [name] MRN: [id number]  Date: 02/09/2018  DOB: 01-31-1951  Follow-Up Visit Note  CC: [name], MD  [name], *  Diagnosis and Prior Radiotherapy:       ICD-10-CM   1. Cancer of tonsillar fossa (HCC) C09.0    Cancer Staging Cancer of tonsillar fossa ([place]) Staging form: Pharynx - HPV-Mediated Oropharynx, AJCC 8th Edition - Clinical: Stage I (cT1, cN1, cM0, p16: Unknown) - Signed by [name], MD on 01/29/2017 - Pathologic stage from 09/02/2017: Stage I (pT1, pN1, cM0, p16+) - Signed by [name], MD on 09/02/2017  Radiation treatment dates:  09/24/2017 - 11/05/2017  Site/dose:    HN_Lt_tonsil bed and bilateral neck/ 60 Gy delivered in 30 fractions of 2 Gy  CHIEF COMPLAINT:  Here for follow-up and surveillance of tonsillar fossa cancer  Narrative:  The patient returns today for routine follow-up after completing radiation therapy on 11/05/2017. He is accompanied by his wife and daughter  ain issues, if any: He denies pain Using a feeding tube?: N/A Weight changes, if any:  Wt Readings from Last 3 Encounters:  02/09/18 175 lb 12.8 oz (79.7 kg)  12/24/17 186 lb (84.4 kg)  11/19/17 187 lb 9.6 oz (85.1 kg)   Swallowing issues, if any: He is not eating bread. He is eating softer foods that are moistened and liquid type foods like soups. He is drinking ensure 2-3 times daily.  Smoking or chewing tobacco? No Using fluoride trays daily? He is using the trays most nights. Last ENT visit was on: Not since diagnosis.  Other notable issues, if any:  He has an apt with Dr. Irene Limbo today after this follow up.  02/03/18 PET scan was reviewed by me.  NED though there is a Rt apical lung lesion (55mm) warranting follow-up. I favor this is inflammatory.  He saw Garald Balding SLP on 01/19/18 and Dr. Enrique Sack on 12/24/17   ALLERGIES:  is allergic to bismuth subsalicylate.  Meds: Current  Outpatient Medications  Medication Sig Dispense Refill  . insulin NPH-regular Human (NOVOLIN 70/30) (70-30) 100 UNIT/ML injection 20 Units.     . timolol (TIMOPTIC) 0.5 % ophthalmic solution     . atorvastatin (LIPITOR) 10 MG tablet TAKE 1 TABLET BY MOUTH ONCE A WEEK FOR HIGH CHOLESTEROL  3   No current facility-administered medications for this encounter.     Physical Findings: The patient is in no acute distress. Patient is alert and oriented. Wt Readings from Last 3 Encounters:  02/09/18 175 lb 12.8 oz (79.7 kg)  02/09/18 175 lb 12.8 oz (79.7 kg)  12/24/17 186 lb (84.4 kg)    height is 5\' 9"  (1.753 m) and weight is 175 lb 12.8 oz (79.7 kg). His oral temperature is 98.2 F (36.8 C). His blood pressure is 116/75 and his pulse is 64. His oxygen saturation is 98%.  HEENT: Head is normocephalic. Extraocular movements are intact. Oropharynx is notable for no lesions Neck: Neck is notable for no palpable masses.  Skin: Skin in treatment fields shows satisfactory healing - smooth, intact.  Heart: Regular in rate and rhythm with no murmurs, rubs, or gallops. Chest: Clear to auscultation bilaterally, with no rhonchi, wheezes, or rales.  ECOG 0  Lab Findings: Lab Results  Component Value Date   WBC 4.2 02/09/2018   HGB 15.0 02/09/2018   HCT 42.9 02/09/2018   MCV 92.7 02/09/2018   PLT 206 02/09/2018  Lab Results  Component Value Date   TSH 1.341 09/02/2017    Radiographic Findings: Nm Pet Image Restag (ps) Skull Base To Thigh  Result Date: 02/05/2018 CLINICAL DATA:  Subsequent treatment strategy for head neck carcinoma. Squamous cell carcinoma EXAM: NUCLEAR MEDICINE PET SKULL BASE TO THIGH TECHNIQUE: 9.0 mCi F-18 FDG was injected intravenously. Full-ring PET imaging was performed from the skull base to thigh after the radiotracer. CT data was obtained and used for attenuation correction and anatomic localization. Fasting blood glucose: 171 mg/dl COMPARISON:  None. FINDINGS:  Mediastinal blood pool activity: SUV max 3.1 NECK: Resolution of the hypermetabolic level 2 lymph node in the LEFT neck seen on comparison exam. No hypermetabolic activity in the a pharyngeal mucosa of the hypopharynx. No hypermetabolic cervical lymph nodes on the RIGHT. Physiologic logic uptake noted in strap muscles on the RIGHT Incidental CT findings: none CHEST: No hypermetabolic mediastinal or hilar nodes. New 6 mm nodule in the RIGHT upper lobe (image 17/8). Nodule has mild metabolic activity SUV max equal 2.4. Nodule has irregular margins and may represent a focus infection or inflammation. No additional pulmonary nodules. Incidental CT findings: ABDOMEN/PELVIS: No abnormal hypermetabolic activity within the liver, pancreas, adrenal glands, or spleen. No hypermetabolic lymph nodes in the abdomen or pelvis. Incidental CT findings: none SKELETON: No focal hypermetabolic activity to suggest skeletal metastasis. Incidental CT findings: none IMPRESSION: 1. Resolution hypermetabolic LEFT level 2 lymph node. No 2 no hypermetabolic lymph nodes in the neck. 2. No residual hypermetabolic activity in the hypopharynx. 3. Single new RIGHT upper lobe pulmonary nodule with mild metabolic activity is indeterminate. Recommend short-term CT follow-up to demonstrate stability/resolution or growth (3 months). Electronically Signed   By: Suzy Bouchard M.D.   On: 02/05/2018 17:01    Impression/Plan:    1) Head and Neck Cancer Status:NED, will obtain 32mo CT chest to follow pulmonary nodule  2) Nutritional Status: slight weight loss. Has adjusted insulin dosing due to hypoglycemia - managing w/ PCP. Glucose a bit low on labs today - pt will eat a snack now. PEG tube: n/a  3) Swallowing: no issues  4) Dental: Encouraged to continue regular followup with dentistry, and dental hygiene including fluoride rinses.   5) Thyroid function: check annually Lab Results  Component Value Date   TSH 1.341 09/02/2017    6)  Other: I recommended he see a dermatologist to get a full skin exam at least once a year.  Consult pending at [facility] dermatology.  7) Follow-up in 3 months after CT chest. He has an appointment with Dr. Irene Limbo today. The patient was encouraged to call with any issues or questions before then. Followup at schedule with Dr Conley Canal this month.  Addendum - chest CT has been ordered by Dr Irene Limbo.  I spent over 15 minutes  face to face with the patient and more than 50% of that time was spent in counseling and/or coordination of care. _____________________________________   [name], MD  This document serves as a record of services personally performed by [name], MD. It was created on his behalf by Wilburn Mylar, a trained medical scribe. The creation of this record is based on the scribe's personal observations and the provider's statements to them. This document has been checked and approved by the attending provider.

## 2018-02-12 ENCOUNTER — Other Ambulatory Visit: Payer: Self-pay | Admitting: Hematology

## 2018-02-12 DIAGNOSIS — C760 Malignant neoplasm of head, face and neck: Secondary | ICD-10-CM

## 2018-02-12 MED ORDER — PILOCARPINE HCL 5 MG PO TABS: 5.0000 mg | ORAL_TABLET | Freq: Two times a day (BID) | ORAL | 0 refills | Status: DC

## 2018-03-11 DIAGNOSIS — H401112 Primary open-angle glaucoma, right eye, moderate stage: Secondary | ICD-10-CM | POA: Diagnosis not present

## 2018-03-11 DIAGNOSIS — H401121 Primary open-angle glaucoma, left eye, mild stage: Secondary | ICD-10-CM | POA: Diagnosis not present

## 2018-03-17 DIAGNOSIS — E782 Mixed hyperlipidemia: Secondary | ICD-10-CM | POA: Diagnosis not present

## 2018-03-17 DIAGNOSIS — I10 Essential (primary) hypertension: Secondary | ICD-10-CM | POA: Diagnosis not present

## 2018-03-17 DIAGNOSIS — E1165 Type 2 diabetes mellitus with hyperglycemia: Secondary | ICD-10-CM | POA: Diagnosis not present

## 2018-03-17 DIAGNOSIS — E11649 Type 2 diabetes mellitus with hypoglycemia without coma: Secondary | ICD-10-CM | POA: Diagnosis not present

## 2018-03-17 DIAGNOSIS — R42 Dizziness and giddiness: Secondary | ICD-10-CM | POA: Diagnosis not present

## 2018-03-19 DIAGNOSIS — E782 Mixed hyperlipidemia: Secondary | ICD-10-CM | POA: Diagnosis not present

## 2018-03-19 DIAGNOSIS — I10 Essential (primary) hypertension: Secondary | ICD-10-CM | POA: Diagnosis not present

## 2018-03-19 DIAGNOSIS — Z0001 Encounter for general adult medical examination with abnormal findings: Secondary | ICD-10-CM | POA: Diagnosis not present

## 2018-03-19 DIAGNOSIS — C091 Malignant neoplasm of tonsillar pillar (anterior) (posterior): Secondary | ICD-10-CM | POA: Diagnosis not present

## 2018-03-19 DIAGNOSIS — Z23 Encounter for immunization: Secondary | ICD-10-CM | POA: Diagnosis not present

## 2018-03-19 DIAGNOSIS — Z6824 Body mass index (BMI) 24.0-24.9, adult: Secondary | ICD-10-CM | POA: Diagnosis not present

## 2018-03-19 DIAGNOSIS — E1165 Type 2 diabetes mellitus with hyperglycemia: Secondary | ICD-10-CM | POA: Diagnosis not present

## 2018-03-19 DIAGNOSIS — Z9189 Other specified personal risk factors, not elsewhere classified: Secondary | ICD-10-CM | POA: Diagnosis not present

## 2018-03-30 DIAGNOSIS — Z85818 Personal history of malignant neoplasm of other sites of lip, oral cavity, and pharynx: Secondary | ICD-10-CM | POA: Diagnosis not present

## 2018-03-30 DIAGNOSIS — Z08 Encounter for follow-up examination after completed treatment for malignant neoplasm: Secondary | ICD-10-CM | POA: Diagnosis not present

## 2018-03-30 DIAGNOSIS — Z87891 Personal history of nicotine dependence: Secondary | ICD-10-CM | POA: Diagnosis not present

## 2018-03-30 DIAGNOSIS — C099 Malignant neoplasm of tonsil, unspecified: Secondary | ICD-10-CM | POA: Diagnosis not present

## 2018-03-30 DIAGNOSIS — Z483 Aftercare following surgery for neoplasm: Secondary | ICD-10-CM | POA: Diagnosis not present

## 2018-04-07 DIAGNOSIS — L821 Other seborrheic keratosis: Secondary | ICD-10-CM | POA: Diagnosis not present

## 2018-04-07 DIAGNOSIS — D485 Neoplasm of uncertain behavior of skin: Secondary | ICD-10-CM | POA: Diagnosis not present

## 2018-04-07 DIAGNOSIS — L570 Actinic keratosis: Secondary | ICD-10-CM | POA: Diagnosis not present

## 2018-04-07 DIAGNOSIS — L814 Other melanin hyperpigmentation: Secondary | ICD-10-CM | POA: Diagnosis not present

## 2018-05-07 NOTE — Progress Notes (Signed)
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 male who has been referred to [place] by Dr. [name], his ENT, for evaluation and management of Squamous Cell Carcinoma of the head and Neck. The patient's last visit with [place] was on 02/09/18. The pt reports that he is doing well overall.   The pt reports that he has not developed any new concerns. He notes that he has not difficulty swallowing. He tried the Pilocarpine and notes some improvement in his dry mouth.   The pt notes that he is consuming 2 Boosts at breakfast, lunch and dinner, and also eats a regular dinner. His weight has improved to 179 pounds. He has followed up with his PCP for optimization of his DM management after his prior weight loss.   The pt notes that he has not experienced any CP, SOB or leg swelling.   Lab results today (05/08/18) of CBC w/diff and CMP is as follows: all values are WNL except for Glucose at 61. 05/08/18 TSH is at 1.652  On review of systems, pt reports weight gain, improving nourishment, and denies fevers, chills, night sweats, SOB, CP, abdominal pains, changes in bowel habits, and any other symptoms.    MEDICAL HISTORY:  Past Medical History:  Diagnosis Date  . Diabetes mellitus ([place])   . Glaucoma   . History of radiation therapy 09/24/17- 11/05/17   head and neck, Left tonsil/ 60 gy delivered in 30 fractions of 2 Gy.   . Kidney stones 1985  . Neck mass 12/24/2016    SURGICAL HISTORY: Past Surgical  History:  Procedure Laterality Date  . 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 FINE NEEDLE ASPIRATION  12/25/2016   Left neck, Level 2  . NASAL SEPTUM SURGERY     remotely  . TOE SURGERY     broken toe with bone ground down.     SOCIAL HISTORY: Social History   Socioeconomic History  . Marital status: Married    Spouse name: Not on file  . Number of children: Not on file  . Years of education: Not on file  . Highest education level: Not on file  Occupational History  . Not on file  Social Needs  . Financial resource strain: Not on file  . Food insecurity:    Worry: Not on file    Inability: Not on file  . Transportation needs:    Medical: Not on file    Non-medical: Not on file  Tobacco Use  . Smoking status: Former Smoker    Packs/day: 1.00    Years: 10.00    Pack years: 10.00    Types: Cigarettes    Last attempt to quit: 06/03/1985    Years since quitting: 32.9  . Smokeless tobacco: Former Systems developer    Types: [place] date: 06/03/1985  Substance and Sexual Activity  . Alcohol use: No  . Drug use: No  . Sexual activity: Not on file  Lifestyle  . Physical activity:  Days per week: Not on file    Minutes per session: Not on file  . Stress: Not on file  Relationships  . Social connections:    Talks on phone: Not on file    Gets together: Not on file    Attends religious service: Not on file    Active member of club or organization: Not on file    Attends meetings of clubs or organizations: Not on file    Relationship status: Not on file  . Intimate partner violence:    Fear of current or ex partner: No    Emotionally abused: No    Physically abused: No    Forced sexual activity: No  Other Topics Concern  . Not on file  Social History Narrative  . Not on file    FAMILY HISTORY: His mother had mucosal melanoma and father had prostate cancer. His sister had stage 4 pancreatic cancer.  His mother and father had DM.  His maternal grandfather had  leukemia.  His maternal uncle had brain cancer.   ALLERGIES:  is allergic to bismuth subsalicylate.  MEDICATIONS:  Current Outpatient Medications  Medication Sig Dispense Refill  . atorvastatin (LIPITOR) 10 MG tablet TAKE 1 TABLET BY MOUTH ONCE A WEEK FOR HIGH CHOLESTEROL  3  . insulin NPH-regular Human (NOVOLIN 70/30) (70-30) 100 UNIT/ML injection 20 Units.     . pilocarpine (SALAGEN) 5 MG tablet Take 1 tablet (5 mg total) by mouth 2 (two) times daily. Watch for flushing, palpitations, dizziness and stop if this occurs 60 tablet 0  . timolol (TIMOPTIC) 0.5 % ophthalmic solution      No current facility-administered medications for this visit.     REVIEW OF SYSTEMS:    A 10+ POINT REVIEW OF SYSTEMS WAS OBTAINED including neurology, dermatology, psychiatry, cardiac, respiratory, lymph, extremities, GI, GU, Musculoskeletal, constitutional, breasts, reproductive, HEENT.  All pertinent positives are noted in the HPI.  All others are negative.   PHYSICAL EXAMINATION: ECOG PERFORMANCE STATUS: 1 - Symptomatic but completely ambulatory  .VS reviewed in [place], in no acute distress and comfortable SKIN: no acute rashes, no significant lesions EYES: conjunctiva are pink and non-injected, sclera anicteric OROPHARYNX: MMM, no exudates, no oropharyngeal erythema or ulceration NECK: supple, no JVD LYMPH:  no palpable lymphadenopathy in the cervical, axillary or inguinal regions LUNGS: clear to auscultation b/l with normal respiratory effort HEART: regular rate & rhythm ABDOMEN:  normoactive bowel sounds , non tender, not distended. No palpable hepatosplenomegaly.  Extremity: no pedal edema PSYCH: alert & oriented x 3 with fluent speech NEURO: no focal motor/sensory deficits   LABORATORY DATA:  I have reviewed the data as listed  . CBC Latest Ref Rng & Units 05/08/2018 02/09/2018 02/03/2018  WBC 4.0 - 10.5 K/uL 4.7 4.2 4.2  Hemoglobin 13.0 - 17.0 g/dL 14.1 15.0 15.3  Hematocrit  39.0 - 52.0 % 41.3 42.9 45.0  Platelets 150 - 400 K/uL 229 206 214    . CMP Latest Ref Rng & Units 05/08/2018 02/09/2018 02/03/2018  Glucose 70 - 99 mg/dL 61(L) 61(L) 114(H)  BUN 8 - 23 mg/dL 15 13 12   Creatinine 0.61 - 1.24 mg/dL 1.18 1.05 1.13  Sodium 135 - 145 mmol/L 141 140 139  Potassium 3.5 - 5.1 mmol/L 4.4 4.0 4.5  Chloride 98 - 111 mmol/L 106 106 105  CO2 22 - 32 mmol/L 26 27 26   Calcium 8.9 - 10.3 mg/dL 9.5 9.4 9.5  Total Protein 6.5 - 8.1 g/dL 7.2  7.3 7.3  Total Bilirubin 0.3 - 1.2 mg/dL 0.3 0.4 0.4  Alkaline Phos 38 - 126 U/L 80 69 72  AST 15 - 41 U/L 26 20 22   ALT 0 - 44 U/L 22 18 21      PATHOLOGY   Diagnosis 12/25/16   CONSULT SLIDE, LEFT NECK LEVEL 2 MALIGNANT CELLS PRESENT, CONSISTENT WITH SQUAMOUS CELL CARCINOMA. 04/16/2017  03/17/2017  02/20/2017   Surgical Pathology Tissue ExamResulted: 08/19/2017 4:44 PM [facility] Specimen Collected on  Lymph Node 08/15/2017 9:02 PM  Result Narrative    ACCESSION TIWPYK:[id number] RECEIVED: 08/15/2017 ORDERING PHYSICIAN:CHRISTOPHER A SULLIVAN , MD PATIENT NAME:Bou, Verlie [facility] SURGICAL PATHOLOGY REPORT  FINAL PATHOLOGIC DIAGNOSIS MICROSCOPIC EXAMINATION AND DIAGNOSIS   "LEFT LEVEL 3&5 LYMPH NODE", EXCISON: Metastatic squamous cell carcinoma involving one of eight lymph nodes (1/8).    I have personally reviewed the slides and/or other related materials referenced, and have edited the report as part of my pathologic assessment and final interpretation.  Electronically Signed Out By: Sherlon Handing , MD 08/19/2017 16:44:05    RADIOGRAPHIC STUDIES: I have personally reviewed the radiological images as listed and agreed with the findings in the report.  PET/CT 07/16/2017 PET HEAD AND NECK CANCER (W/LOW DOSE CT)07/16/2017 [facility] Result Impression   1.Hypermetabolic left level III cervical lymph node consistent with metastatic  disease.  2.Additional nonhypermetabolic subcentimeter left level V cervical lymph nodes are also noted. They are of questionable significance  3.Focal area of increased uptake in the posterior left liver, of questionable significance. Recommend MRI of the liver for further evaluation. 4.Apparent bladder wall thickening which may in part be due to underdistention. Consider urinalysis and urology consult if clinically indicated. 5.Ancillary CT findings as above.    PET 01/14/17 IMPRESSION: 1. Focal area of FDG uptake in the left lateral pharynx/upper left tonsillar region likely representing the primary neoplasm. 2. Metastatic hypermetabolic left level 2 neck nodes. No other FDG positive nodal stations or contralateral adenopathy. 3. No findings for metastatic disease.   CT Soft Tissue W Contrast 01/03/17 IMPRESSION: 1. Abnormally enlarged, solid left level 2 lymph node up to 4.3 cm corresponds to the palpable area of concern. Other left level 2 and level 3 nodes are normal by size criteria but asymmetrically increased. No contralateral lymphadenopathy. No primary tumor identified. 2. Burtis Junes this is metastatic nodal disease from an occult pharyngeal/laryngeal primary. Less likely differential considerations are lymphoproliferative disorder, lymphoma, or metastatic disease from a regional skin cancer or a distant primary. 3. Tiny right apical lung nodules appear postinflammatory.   ASSESSMENT & PLAN:   JONATHANDAVID MARLETT is a 67 y.o. 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 Only 10pk-yr h/o smoking and quit in 1982 patient is s/p TORS surgery to confirm that the left tonsils/BOT area is the primary lesion as suggested by PET/CT . -he has subsequently had radical lymph node dissection of his neck which shows 1 out of 21 lymph nodes with  metastatic disease 5.1 cm without ENE. --patient had rpt PET/CT on 07/16/2017 which showed  hypermetabolic left level III cervical lymph node consistent with metastatic disease. Additional nonhypermetabolic subcentimeter left level V cervical lymph nodes are also noted. They are of questionable significance. Focal area of increased uptake in the posterior left liver, of questionable significance - on 08/15/2017 by Dr  Conley Canal yielded metastatic squamous cell carcinoma in 1 out of 8 lymph nodes. -Completed adjuvant IMRT with Dr Isidore Moos  02/05/18 PET/CT which revealed  Resolution hypermetabolic LEFT level 2 lymph node. No 2 no hypermetabolic lymph nodes in the neck. 2. No residual hypermetabolic activity in the hypopharynx. 3. Single new RIGHT upper lobe pulmonary nodule with mild metabolic activity is indeterminate. Recommend short-term CT follow-up to demonstrate stability/resolution or growth.   PLAN  -Discussed pt labwork today, 05/08/18; blood counts are normal, blood chemistries are stable. TSH is normal at 1.652 -Proceed with CT Chest to watch lung nodule, as previously ordered -continue follow up with Dr. Isidore Moos next week in [place] -The pt shows no clinical or lab progression/return of his Squamous cell carcinoma at this time.  -No indication for further treatment at this time.   -Pilocarpine po for dry mouth -Will see the pt back in 3 months, sooner if any new concerns   #3 Diabetes Plan -recommended drinking atleast 60OZ of water daily -close f/u with PCP to optimize DM2 management. -Recommend that PCP consider basal insulin like Lantus as opposed to NPH    CT chest in 3-4 days RTC with Dr Irene Limbo with labs in 3 months   All of the patients questions were answered with apparent satisfaction. The patient knows to call the clinic with any problems, questions or concerns.  The total time spent in the appt was 20 minutes and more than 50% was on counseling and direct patient cares.      Sullivan Lone MD MS AAHIVMS [facility] Hematology/Oncology Physician [facility]  (Office):       [phone] (Work cell):  [phone] (Fax):           [phone]  05/08/2018 12:00 PM  I, Baldwin [place], am acting as a scribe for Dr. Sullivan Lone.   .I have reviewed the above documentation for accuracy and completeness, and I agree with the above. Brunetta Genera MD

## 2018-05-07 NOTE — Progress Notes (Signed)
Mr. Farrior presents for follow up of radiation completed 11/05/17 to his left tonsil bed and bilateral neck.   Pain issues, if any: No pain Using a feeding tube?: No Weight changes, if any:  Wt Readings from Last 3 Encounters:  05/15/18 178 lb 9.6 oz (81 kg)  05/08/18 179 lb 1.6 oz (81.2 kg)  02/09/18 175 lb 12.8 oz (79.7 kg)   Swallowing issues, if any: He is drinking 3 ensure daily. He eats supper. He does not necessarily enjoy food anymore.  Smoking or chewing tobacco? No Using fluoride trays daily? No Last ENT visit was on: Dr. Conley Canal 03/30/18 next on 06/29/18 Other notable issues, if any:   Chest CT 05/13/18  Dr. Irene Limbo 05/08/18  BP 135/77 (BP Location: Right Arm)   Pulse 70   Temp 97.7 F (36.5 C) (Oral)   Wt 178 lb 9.6 oz (81 kg)   SpO2 98% Comment: room air  BMI 26.37 kg/m

## 2018-05-08 ENCOUNTER — Inpatient Hospital Stay: Payer: Medicare Other | Attending: Radiation Oncology

## 2018-05-08 ENCOUNTER — Telehealth: Payer: Self-pay | Admitting: Hematology

## 2018-05-08 ENCOUNTER — Inpatient Hospital Stay (HOSPITAL_BASED_OUTPATIENT_CLINIC_OR_DEPARTMENT_OTHER): Payer: Medicare Other | Admitting: Hematology

## 2018-05-08 VITALS — BP 126/73 | HR 58 | Temp 98.0°F | Resp 18 | Ht 69.0 in | Wt 179.1 lb

## 2018-05-08 DIAGNOSIS — C760 Malignant neoplasm of head, face and neck: Secondary | ICD-10-CM

## 2018-05-08 DIAGNOSIS — Z8042 Family history of malignant neoplasm of prostate: Secondary | ICD-10-CM

## 2018-05-08 DIAGNOSIS — Z923 Personal history of irradiation: Secondary | ICD-10-CM

## 2018-05-08 DIAGNOSIS — Z79899 Other long term (current) drug therapy: Secondary | ICD-10-CM

## 2018-05-08 DIAGNOSIS — Z87891 Personal history of nicotine dependence: Secondary | ICD-10-CM | POA: Insufficient documentation

## 2018-05-08 DIAGNOSIS — E119 Type 2 diabetes mellitus without complications: Secondary | ICD-10-CM

## 2018-05-08 DIAGNOSIS — Z806 Family history of leukemia: Secondary | ICD-10-CM | POA: Diagnosis not present

## 2018-05-08 DIAGNOSIS — Z800 Family history of malignant neoplasm of digestive organs: Secondary | ICD-10-CM | POA: Diagnosis not present

## 2018-05-08 DIAGNOSIS — C090 Malignant neoplasm of tonsillar fossa: Secondary | ICD-10-CM

## 2018-05-08 DIAGNOSIS — Z808 Family history of malignant neoplasm of other organs or systems: Secondary | ICD-10-CM | POA: Insufficient documentation

## 2018-05-08 DIAGNOSIS — R634 Abnormal weight loss: Secondary | ICD-10-CM

## 2018-05-08 DIAGNOSIS — R911 Solitary pulmonary nodule: Secondary | ICD-10-CM | POA: Insufficient documentation

## 2018-05-08 LAB — CMP (CANCER CENTER ONLY)
ALK PHOS: 80 U/L (ref 38–126)
ALT: 22 U/L (ref 0–44)
AST: 26 U/L (ref 15–41)
Albumin: 3.9 g/dL (ref 3.5–5.0)
Anion gap: 9 (ref 5–15)
BILIRUBIN TOTAL: 0.3 mg/dL (ref 0.3–1.2)
BUN: 15 mg/dL (ref 8–23)
CALCIUM: 9.5 mg/dL (ref 8.9–10.3)
CO2: 26 mmol/L (ref 22–32)
CREATININE: 1.18 mg/dL (ref 0.61–1.24)
Chloride: 106 mmol/L (ref 98–111)
GFR, Est AFR Am: >60 mL/min (ref >60)
Glucose, Bld: 61 mg/dL — ABNORMAL LOW (ref 70–99)
Potassium: 4.4 mmol/L (ref 3.5–5.1)
Sodium: 141 mmol/L (ref 135–145)
TOTAL PROTEIN: 7.2 g/dL (ref 6.5–8.1)

## 2018-05-08 LAB — CBC WITH DIFFERENTIAL/PLATELET
ABS IMMATURE GRANULOCYTES: 0.02 10*3/uL (ref 0.00–0.07)
BASOS PCT: 0 %
Basophils Absolute: 0 10*3/uL (ref 0.0–0.1)
EOS ABS: 0.1 10*3/uL (ref 0.0–0.5)
Eosinophils Relative: 3 %
HCT: 41.3 % (ref 39.0–52.0)
Hemoglobin: 14.1 g/dL (ref 13.0–17.0)
Immature Granulocytes: 0 %
LYMPHS PCT: 25 %
Lymphs Abs: 1.2 10*3/uL (ref 0.7–4.0)
MCH: 31.8 pg (ref 26.0–34.0)
MCHC: 34.1 g/dL (ref 30.0–36.0)
MCV: 93 fL (ref 80.0–100.0)
MONO ABS: 0.5 10*3/uL (ref 0.1–1.0)
Monocytes Relative: 10 %
Neutro Abs: 2.9 10*3/uL (ref 1.7–7.7)
Neutrophils Relative %: 62 %
PLATELETS: 229 10*3/uL (ref 150–400)
RBC: 4.44 MIL/uL (ref 4.22–5.81)
RDW: 12.6 % (ref 11.5–15.5)
WBC: 4.7 10*3/uL (ref 4.0–10.5)
nRBC: 0 % (ref 0.0–0.2)

## 2018-05-08 LAB — TSH: TSH: 1.652 u[IU]/mL (ref 0.320–4.118)

## 2018-05-08 NOTE — Telephone Encounter (Signed)
Printed calendar and avs. °

## 2018-05-11 ENCOUNTER — Telehealth: Payer: Self-pay | Admitting: *Deleted

## 2018-05-11 NOTE — Telephone Encounter (Signed)
Ms. Yehle called to find out when CT was scheduled. Advised her that per chart, it is 12/11 at [facility] Radiology at 2:30. Gave her the number for Radiology scheduling [phone]. Verbalized understanding.

## 2018-05-13 ENCOUNTER — Encounter (HOSPITAL_COMMUNITY): Payer: Self-pay

## 2018-05-13 ENCOUNTER — Ambulatory Visit (HOSPITAL_COMMUNITY)
Admission: RE | Admit: 2018-05-13 | Discharge: 2018-05-13 | Disposition: A | Discharge status: Home / Self Care | Payer: Medicare Other | Source: Ambulatory Visit | Attending: Hematology | Admitting: Hematology

## 2018-05-13 DIAGNOSIS — R911 Solitary pulmonary nodule: Secondary | ICD-10-CM

## 2018-05-15 ENCOUNTER — Other Ambulatory Visit: Payer: Self-pay

## 2018-05-15 ENCOUNTER — Ambulatory Visit
Admission: RE | Admit: 2018-05-15 | Discharge: 2018-05-15 | Disposition: A | Discharge status: Home / Self Care | Payer: Medicare Other | Source: Ambulatory Visit | Attending: Radiation Oncology | Admitting: Radiation Oncology

## 2018-05-15 ENCOUNTER — Encounter: Payer: Self-pay | Admitting: Radiation Oncology

## 2018-05-15 VITALS — BP 135/77 | HR 70 | Temp 97.7°F | Wt 178.6 lb

## 2018-05-15 DIAGNOSIS — Z85818 Personal history of malignant neoplasm of other sites of lip, oral cavity, and pharynx: Secondary | ICD-10-CM | POA: Diagnosis not present

## 2018-05-15 DIAGNOSIS — C090 Malignant neoplasm of tonsillar fossa: Secondary | ICD-10-CM

## 2018-05-15 DIAGNOSIS — R439 Unspecified disturbances of smell and taste: Secondary | ICD-10-CM | POA: Diagnosis not present

## 2018-05-15 DIAGNOSIS — Z08 Encounter for follow-up examination after completed treatment for malignant neoplasm: Secondary | ICD-10-CM | POA: Diagnosis not present

## 2018-05-15 NOTE — Progress Notes (Signed)
Radiation Oncology         [phone] ________________________________  Name: Tracy [name] MRN: [id number]  Date: 05/15/2018  DOB: 1950-07-28  Follow-Up Visit Note  CC: [name], MD  [name], *  Diagnosis and Prior Radiotherapy:       ICD-10-CM   1. Cancer of tonsillar fossa (HCC) C09.0    Cancer Staging Cancer of tonsillar fossa ([place]) Staging form: Pharynx - HPV-Mediated Oropharynx, AJCC 8th Edition - Clinical: Stage I (cT1, cN1, cM0, p16: Unknown) - Signed by [name], MD on 01/29/2017 - Pathologic stage from 09/02/2017: Stage I (pT1, pN1, cM0, p16+) - Signed by [name], MD on 09/02/2017  09/24/2017 - 11/05/2017: Left tonsil bed and bilateral neck/ 60 Gy delivered in 30 fractions  CHIEF COMPLAINT:  Here for follow-up and surveillance of tonsillar fossa cancer  Narrative:  The patient returns today for routine follow-up after completing radiation therapy on 11/05/2017. He is accompanied by his wife and daughter. Most recent chest CT scan performed on 05/13/18 showed no suspicious pulmonary nodules. He last saw Dr. Irene Limbo on 05/08/18.  Pain issues, if any: He denies pain  Weight changes, if any: Wt Readings from Last 3 Encounters:  05/15/18 178 lb 9.6 oz (81 kg)  05/08/18 179 lb 1.6 oz (81.2 kg)  02/09/18 175 lb 12.8 oz (79.7 kg)   Swallowing issues, if any: None. He is drinking ensure 3 times daily and eats supper.  Smoking or chewing tobacco? No  Using fluoride trays daily? No  Last ENT visit was on: 03/30/18, Dr. Conley Canal, follow up on 06/29/18.  Other notable issues, if any: Still has issues with taste.  Patient saw dermatology on 04/07/18. He underwent shave biopsy of an 8 mm keratotic papule located on his left lateral external ear antihelix. He reports it was precancerous but was told everything was fine.   ALLERGIES:  is allergic to bismuth subsalicylate.  Meds: Current Outpatient Medications  Medication Sig Dispense Refill  . insulin  NPH-regular Human (NOVOLIN 70/30) (70-30) 100 UNIT/ML injection 20 Units.     Marland Kitchen latanoprost (XALATAN) 0.005 % ophthalmic solution 1 drop at bedtime.    . timolol (TIMOPTIC) 0.5 % ophthalmic solution     . atorvastatin (LIPITOR) 10 MG tablet TAKE 1 TABLET BY MOUTH ONCE A WEEK FOR HIGH CHOLESTEROL  3  . pilocarpine (SALAGEN) 5 MG tablet Take 1 tablet (5 mg total) by mouth 2 (two) times daily. Watch for flushing, palpitations, dizziness and stop if this occurs (Patient not taking: Reported on 05/15/2018) 60 tablet 0   No current facility-administered medications for this encounter.     Physical Findings: The patient is in no acute distress. Patient is alert and oriented. Wt Readings from Last 3 Encounters:  05/15/18 178 lb 9.6 oz (81 kg)  05/08/18 179 lb 1.6 oz (81.2 kg)  02/09/18 175 lb 12.8 oz (79.7 kg)    weight is 178 lb 9.6 oz (81 kg). His oral temperature is 97.7 F (36.5 C). His blood pressure is 135/77 and his pulse is 70. His oxygen saturation is 98%.  General: Alert and oriented, in no acute distress HEENT: Head is normocephalic. Extraocular movements are intact. Oropharynx is clear with no lesions. Neck: Neck is supple, no palpable cervical or supraclavicular lymphadenopathy. Heart: Regular in rate and rhythm with no murmurs, rubs, or gallops. Chest: Clear to auscultation bilaterally, with no rhonchi, wheezes, or rales. Lymphatics: see Neck Exam Skin: No concerning lesions. Psychiatric: Judgment and insight are intact.  Affect is appropriate.  ECOG 0  Lab Findings: Lab Results  Component Value Date   WBC 4.7 05/08/2018   HGB 14.1 05/08/2018   HCT 41.3 05/08/2018   MCV 93.0 05/08/2018   PLT 229 05/08/2018    Lab Results  Component Value Date   TSH 1.652 05/08/2018    Radiographic Findings: Ct Chest Wo Contrast  Result Date: 05/13/2018 CLINICAL DATA:  Follow-up pulmonary nodule. History of tonsillar cancer, status post XRT. EXAM: CT CHEST WITHOUT CONTRAST  TECHNIQUE: Multidetector CT imaging of the chest was performed following the standard protocol without IV contrast. COMPARISON:  PET-CT dated 02/05/2018 FINDINGS: Cardiovascular: Heart is normal in size.  No pericardial effusion. No evidence of thoracic aortic aneurysm. Very mild atherosclerotic calcifications of the aortic arch. Coronary atherosclerosis of the LAD. Mediastinum/Nodes: No suspicious mediastinal lymphadenopathy. Surgical clips in the left supraclavicular region/left neck. Visualized thyroid is unremarkable. Lungs/Pleura: Minimal subpleural nodularity in the anterior upper lobes (series 3/image 39), likely reflecting radiation changes. The subpleural nodularity on the right accounts for the prior PET-CT abnormality. No suspicious pulmonary nodules. No focal consolidation. No pleural effusion or pneumothorax. Upper Abdomen: Visualized upper abdomen is unremarkable. Musculoskeletal: Visualized osseous structures are within normal limits. IMPRESSION: Radiation changes at the anterior lung apices. No suspicious pulmonary nodules. Dedicated follow-up imaging is not required. Aortic Atherosclerosis (ICD10-I70.0). Electronically Signed   By: Julian Hy M.D.   On: 05/13/2018 17:15    Impression/Plan:  Tonsillar Cancer  1) Head and Neck Cancer Status: In remission - CT chest NED (reviewed by me w/ family/pt), continue to follow w/ physical exams  2) Nutritional Status: Eats supper, supplements with 3 ensure daily  3) Swallowing: no issues   4) Dental: Encouraged to continue regular followup with dentistry, and dental hygiene including fluoride rinses. Recommended fluoride for dental trays or prescription toothpaste.  5) Thyroid function: check annually - WNL Lab Results  Component Value Date   TSH 1.652 05/08/2018    6) Other: Patient continues to have diminished taste. Patient was referred to [facility] on 04/07/18 and will follow up annually.   F/u in January with  ENT, March with Med/onc.  I will see him in June 2020.  I spent 25 minutes face to face with the patient and more than 50% of that time was spent in counseling and/or coordination of care. _____________________________________   [name], MD  This document serves as a record of services personally performed by [name], MD. It was created on his behalf by Wilburn Mylar, a trained medical scribe. The creation of this record is based on the scribe's personal observations and the provider's statements to them. This document has been checked and approved by the attending provider.

## 2018-05-19 ENCOUNTER — Encounter: Payer: Self-pay | Admitting: Radiation Oncology

## 2018-05-19 ENCOUNTER — Other Ambulatory Visit: Payer: Self-pay | Admitting: Radiation Oncology

## 2018-05-19 DIAGNOSIS — Z1329 Encounter for screening for other suspected endocrine disorder: Secondary | ICD-10-CM

## 2018-06-29 DIAGNOSIS — Z85818 Personal history of malignant neoplasm of other sites of lip, oral cavity, and pharynx: Secondary | ICD-10-CM | POA: Diagnosis not present

## 2018-06-29 DIAGNOSIS — C099 Malignant neoplasm of tonsil, unspecified: Secondary | ICD-10-CM | POA: Diagnosis not present

## 2018-06-29 DIAGNOSIS — Z08 Encounter for follow-up examination after completed treatment for malignant neoplasm: Secondary | ICD-10-CM | POA: Diagnosis not present

## 2018-07-09 DIAGNOSIS — E782 Mixed hyperlipidemia: Secondary | ICD-10-CM | POA: Diagnosis not present

## 2018-07-09 DIAGNOSIS — I10 Essential (primary) hypertension: Secondary | ICD-10-CM | POA: Diagnosis not present

## 2018-07-09 DIAGNOSIS — Z9189 Other specified personal risk factors, not elsewhere classified: Secondary | ICD-10-CM | POA: Diagnosis not present

## 2018-07-09 DIAGNOSIS — E1165 Type 2 diabetes mellitus with hyperglycemia: Secondary | ICD-10-CM | POA: Diagnosis not present

## 2018-07-14 DIAGNOSIS — I10 Essential (primary) hypertension: Secondary | ICD-10-CM | POA: Diagnosis not present

## 2018-07-14 DIAGNOSIS — Z23 Encounter for immunization: Secondary | ICD-10-CM | POA: Diagnosis not present

## 2018-07-14 DIAGNOSIS — Z1389 Encounter for screening for other disorder: Secondary | ICD-10-CM | POA: Diagnosis not present

## 2018-07-14 DIAGNOSIS — Z6825 Body mass index (BMI) 25.0-25.9, adult: Secondary | ICD-10-CM | POA: Diagnosis not present

## 2018-07-14 DIAGNOSIS — R4582 Worries: Secondary | ICD-10-CM | POA: Diagnosis not present

## 2018-07-14 DIAGNOSIS — C091 Malignant neoplasm of tonsillar pillar (anterior) (posterior): Secondary | ICD-10-CM | POA: Diagnosis not present

## 2018-07-14 DIAGNOSIS — E1165 Type 2 diabetes mellitus with hyperglycemia: Secondary | ICD-10-CM | POA: Diagnosis not present

## 2018-07-14 DIAGNOSIS — Z1331 Encounter for screening for depression: Secondary | ICD-10-CM | POA: Diagnosis not present

## 2018-08-06 NOTE — Progress Notes (Signed)
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 male who has been referred to [place] by Dr. [name], his ENT, for evaluation and management of Squamous Cell Carcinoma of the head and Neck. The patient's last visit with [place] was on 05/08/18. The pt reports that he is doing well overall.   The pt reports that he has not developed any new concerns in the interim. The pt notes that his mouth "is a little bit dry," and has been using Salagen occasionally, and doesn't feel that this is making much of a difference for him. The pt denies swallowing problems and is eating well, but notes that his taste buds are improving slowly. The pt notes that he has continued with his swallowing exercises. The pt has gained 4 pounds in the interim and is enjoying good energy levels. The pt notes that he saw his ENT Dr. Conley Canal a month ago, and is maintaining 3 month follow ups.  Of note since the patient's last visit, pt has had a CT Chest completed on 05/13/18 with results revealing Radiation changes at the anterior lung apices. No suspicious pulmonary nodules. Dedicated follow-up imaging is not required. Aortic Atherosclerosis.  Lab results today (08/07/18) of CBC w/diff and CMP is as follows: all values are WNL except for Glucose at 67, AST at 42. 08/07/18 Magnesium at 2.1  On review of systems, pt reports good energy levels, eating well, weight gain, staying active, and denies swallowing difficulty, noticing any new lumps or  bumps, abdominal pains, leg swelling, and any other symptoms.    MEDICAL HISTORY:  Past Medical History:  Diagnosis Date  . Diabetes mellitus ([place])   . Glaucoma   . History of radiation therapy 09/24/17- 11/05/17   head and neck, Left tonsil/ 60 gy delivered in 30 fractions of 2 Gy.   . Kidney stones 1985  . Neck mass 12/24/2016    SURGICAL HISTORY: Past Surgical History:  Procedure Laterality Date  . 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 FINE NEEDLE ASPIRATION  12/25/2016   Left neck, Level 2  . NASAL SEPTUM SURGERY     remotely  . TOE SURGERY     broken toe with bone ground down.     SOCIAL HISTORY: Social History   Socioeconomic History  . Marital status: Married    Spouse name: [name]  . Number of children: 5  . Years of education: Not on file  . Highest education level: Not on file  Occupational History  . Not on file  Social Needs  . Financial resource strain: Not on file  . Food insecurity:    Worry: Not on file    Inability: Not on file  . Transportation needs:    Medical: No    Non-medical: No  Tobacco Use  . Smoking status: Former Smoker    Packs/day: 1.00    Years: 10.00    Pack years: 10.00    Types: Cigarettes    Last attempt to quit: 06/03/1985    Years  since quitting: 33.2  . Smokeless tobacco: Former Systems developer    Types: Force date: 06/03/1985  Substance and Sexual Activity  . Alcohol use: No  . Drug use: No  . Sexual activity: Not on file  Lifestyle  . Physical activity:    Days per week: Not on file    Minutes per session: Not on file  . Stress: Not on file  Relationships  . Social connections:    Talks on phone: Not on file    Gets together: Not on file    Attends religious service: Not on file    Active member of club or organization: Not on file    Attends meetings of clubs or organizations: Not on file    Relationship status: Not on file  . Intimate partner violence:    Fear of current or ex partner: No    Emotionally  abused: No    Physically abused: No    Forced sexual activity: No  Other Topics Concern  . Not on file  Social History Narrative  . Not on file    FAMILY HISTORY: His mother had mucosal melanoma and father had prostate cancer. His sister had stage 4 pancreatic cancer.  His mother and father had DM.  His maternal grandfather had leukemia.  His maternal uncle had brain cancer.   ALLERGIES:  is allergic to bismuth subsalicylate.  MEDICATIONS:  Current Outpatient Medications  Medication Sig Dispense Refill  . atorvastatin (LIPITOR) 10 MG tablet TAKE 1 TABLET BY MOUTH ONCE A WEEK FOR HIGH CHOLESTEROL  3  . insulin NPH-regular Human (NOVOLIN 70/30) (70-30) 100 UNIT/ML injection 20 Units.     Marland Kitchen latanoprost (XALATAN) 0.005 % ophthalmic solution 1 drop at bedtime.    . pilocarpine (SALAGEN) 5 MG tablet Take 1 tablet (5 mg total) by mouth 2 (two) times daily. Watch for flushing, palpitations, dizziness and stop if this occurs (Patient not taking: Reported on 05/15/2018) 60 tablet 0  . timolol (TIMOPTIC) 0.5 % ophthalmic solution      No current facility-administered medications for this visit.     REVIEW OF SYSTEMS:    A 10+ POINT REVIEW OF SYSTEMS WAS OBTAINED including neurology, dermatology, psychiatry, cardiac, respiratory, lymph, extremities, GI, GU, Musculoskeletal, constitutional, breasts, reproductive, HEENT.  All pertinent positives are noted in the HPI.  All others are negative.   PHYSICAL EXAMINATION: ECOG PERFORMANCE STATUS: 1 - Symptomatic but completely ambulatory  .VS reviewed in [place], in no acute distress and comfortable SKIN: no acute rashes, no significant lesions EYES: conjunctiva are pink and non-injected, sclera anicteric OROPHARYNX: MMM, no exudates, no oropharyngeal erythema or ulceration NECK: supple, no JVD LYMPH:  no palpable lymphadenopathy in the cervical, axillary or inguinal regions LUNGS: clear to auscultation b/l with normal  respiratory effort HEART: regular rate & rhythm ABDOMEN:  normoactive bowel sounds , non tender, not distended. No palpable hepatosplenomegaly.  Extremity: no pedal edema PSYCH: alert & oriented x 3 with fluent speech NEURO: no focal motor/sensory deficits   LABORATORY DATA:  I have reviewed the data as listed  . CBC Latest Ref Rng & Units 08/07/2018 05/08/2018 02/09/2018  WBC 4.0 - 10.5 K/uL 6.1 4.7 4.2  Hemoglobin 13.0 - 17.0 g/dL 14.4 14.1 15.0  Hematocrit 39.0 - 52.0 % 42.2 41.3 42.9  Platelets 150 - 400 K/uL 234 229 206    . CMP Latest Ref Rng & Units 08/07/2018 05/08/2018 02/09/2018  Glucose 70 - 99 mg/dL 67(L) 61(L) 61(L)  BUN 8 - 23 mg/dL 19 15 13   Creatinine 0.61 - 1.24 mg/dL 1.20 1.18 1.05  Sodium 135 - 145 mmol/L 138 141 140  Potassium 3.5 - 5.1 mmol/L 3.9 4.4 4.0  Chloride 98 - 111 mmol/L 106 106 106  CO2 22 - 32 mmol/L 24 26 27   Calcium 8.9 - 10.3 mg/dL 9.1 9.5 9.4  Total Protein 6.5 - 8.1 g/dL 7.2 7.2 7.3  Total Bilirubin 0.3 - 1.2 mg/dL 0.7 0.3 0.4  Alkaline Phos 38 - 126 U/L 70 80 69  AST 15 - 41 U/L 42(H) 26 20  ALT 0 - 44 U/L 39 22 18     PATHOLOGY   Diagnosis 12/25/16   CONSULT SLIDE, LEFT NECK LEVEL 2 MALIGNANT CELLS PRESENT, CONSISTENT WITH SQUAMOUS CELL CARCINOMA. 04/16/2017  03/17/2017  02/20/2017   Surgical Pathology Tissue ExamResulted: 08/19/2017 4:44 PM [facility] Specimen Collected on  Lymph Node 08/15/2017 9:02 PM  Result Narrative    ACCESSION HCWCBJ:[id number] RECEIVED: 08/15/2017 ORDERING PHYSICIAN:CHRISTOPHER A SULLIVAN , MD PATIENT NAME:Tracy [name], Tracy [name] SURGICAL PATHOLOGY REPORT  FINAL PATHOLOGIC DIAGNOSIS MICROSCOPIC EXAMINATION AND DIAGNOSIS   "LEFT LEVEL 3&5 LYMPH NODE", EXCISON: Metastatic squamous cell carcinoma involving one of eight lymph nodes (1/8).    I have personally reviewed the slides and/or other related materials referenced, and have edited the report as part of  my pathologic assessment and final interpretation.  Electronically Signed Out By: Sherlon Handing , MD 08/19/2017 16:44:05    RADIOGRAPHIC STUDIES: I have personally reviewed the radiological images as listed and agreed with the findings in the report.  PET/CT 07/16/2017 PET HEAD AND NECK CANCER (W/LOW DOSE CT)07/16/2017 [facility] Result Impression   1.Hypermetabolic left level III cervical lymph node consistent with metastatic disease.  2.Additional nonhypermetabolic subcentimeter left level V cervical lymph nodes are also noted. They are of questionable significance  3.Focal area of increased uptake in the posterior left liver, of questionable significance. Recommend MRI of the liver for further evaluation. 4.Apparent bladder wall thickening which may in part be due to underdistention. Consider urinalysis and urology consult if clinically indicated. 5.Ancillary CT findings as above.    PET 01/14/17 IMPRESSION: 1. Focal area of FDG uptake in the left lateral pharynx/upper left tonsillar region likely representing the primary neoplasm. 2. Metastatic hypermetabolic left level 2 neck nodes. No other FDG positive nodal stations or contralateral adenopathy. 3. No findings for metastatic disease.   CT Soft Tissue W Contrast 01/03/17 IMPRESSION: 1. Abnormally enlarged, solid left level 2 lymph node up to 4.3 cm corresponds to the palpable area of concern. Other left level 2 and level 3 nodes are normal by size criteria but asymmetrically increased. No contralateral lymphadenopathy. No primary tumor identified. 2. Burtis Junes this is metastatic nodal disease from an occult pharyngeal/laryngeal primary. Less likely differential considerations are lymphoproliferative disorder, lymphoma, or metastatic disease from a regional skin cancer or a distant primary. 3. Tiny right apical lung nodules appear postinflammatory.   ASSESSMENT & PLAN:   Tracy [name] is a 68 y.o. 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 Only 10pk-yr h/o smoking and quit in 1982 patient is s/p TORS surgery to confirm that the left tonsils/BOT area is the primary lesion as suggested by PET/CT . -he has subsequently had radical lymph node dissection of his neck  which shows 1 out of 21 lymph nodes with metastatic disease 5.1 cm without ENE. --patient had rpt PET/CT on 07/16/2017 which showed  hypermetabolic left level III cervical lymph node consistent with metastatic disease. Additional nonhypermetabolic subcentimeter left level V cervical lymph nodes are also noted. They are of questionable significance. Focal area of increased uptake in the posterior left liver, of questionable significance - on 08/15/2017 by Dr Conley Canal yielded metastatic squamous cell carcinoma in 1 out of 8 lymph nodes. -Completed adjuvant IMRT with Dr Isidore Moos  02/05/18 PET/CT which revealed  Resolution hypermetabolic LEFT level 2 lymph node. No 2 no hypermetabolic lymph nodes in the neck. 2. No residual hypermetabolic activity in the hypopharynx. 3. Single new RIGHT upper lobe pulmonary nodule with mild metabolic activity is indeterminate. Recommend short-term CT follow-up to demonstrate stability/resolution or growth.   PLAN  -Discussed pt labwork today, 08/07/18; blood counts are normal, chemistries are stable. Magnesium normal at 2.1. -Discussed the 05/13/18 CT Chest which revealed Radiation changes at the anterior lung apices. No suspicious pulmonary nodules. Dedicated follow-up imaging is not required. Aortic Atherosclerosis. -The pt shows no clinical or lab progression/return of his squamous cell carcinoma at this time. -No indication for further treatment at this time.   -Continue every 3 month local evaluations with ENT Dr. Conley Canal  -Will see the pt  back in 6 months  #3 Diabetes Plan -recommended drinking atleast 60OZ of water daily -close f/u with PCP to optimize DM2 management. -Recommend that PCP consider basal insulin like Lantus as opposed to NPH    RTC with Dr Irene Limbo with labs in 6 months   All of the patients questions were answered with apparent satisfaction. The patient knows to call the clinic with any problems, questions or concerns.  The total time spent in the appt was 20 minutes and more than 50% was on counseling and direct patient cares.    Sullivan Lone MD MS AAHIVMS [place] [facility] Hematology/Oncology Physician [facility]  (Office):       [phone] (Work cell):  [phone] (Fax):           [phone]  08/07/2018 11:34 AM  I, Baldwin [place], am acting as a scribe for Dr. Sullivan Lone.   .I have reviewed the above documentation for accuracy and completeness, and I agree with the above. Brunetta Genera MD

## 2018-08-07 ENCOUNTER — Inpatient Hospital Stay (HOSPITAL_BASED_OUTPATIENT_CLINIC_OR_DEPARTMENT_OTHER): Payer: Medicare Other | Admitting: Hematology

## 2018-08-07 ENCOUNTER — Inpatient Hospital Stay: Payer: Medicare Other | Attending: Radiation Oncology

## 2018-08-07 ENCOUNTER — Telehealth: Payer: Self-pay | Admitting: Hematology

## 2018-08-07 VITALS — BP 122/68 | HR 71 | Temp 98.0°F | Resp 18 | Ht 69.0 in | Wt 179.8 lb

## 2018-08-07 DIAGNOSIS — Z923 Personal history of irradiation: Secondary | ICD-10-CM | POA: Insufficient documentation

## 2018-08-07 DIAGNOSIS — R911 Solitary pulmonary nodule: Secondary | ICD-10-CM

## 2018-08-07 DIAGNOSIS — E079 Disorder of thyroid, unspecified: Secondary | ICD-10-CM | POA: Insufficient documentation

## 2018-08-07 DIAGNOSIS — C760 Malignant neoplasm of head, face and neck: Secondary | ICD-10-CM

## 2018-08-07 DIAGNOSIS — Z87891 Personal history of nicotine dependence: Secondary | ICD-10-CM | POA: Insufficient documentation

## 2018-08-07 DIAGNOSIS — E119 Type 2 diabetes mellitus without complications: Secondary | ICD-10-CM | POA: Diagnosis not present

## 2018-08-07 LAB — CBC WITH DIFFERENTIAL/PLATELET
ABS IMMATURE GRANULOCYTES: 0.02 10*3/uL (ref 0.00–0.07)
BASOS ABS: 0 10*3/uL (ref 0.0–0.1)
BASOS PCT: 0 %
EOS ABS: 0.2 10*3/uL (ref 0.0–0.5)
Eosinophils Relative: 3 %
HCT: 42.2 % (ref 39.0–52.0)
Hemoglobin: 14.4 g/dL (ref 13.0–17.0)
IMMATURE GRANULOCYTES: 0 %
LYMPHS ABS: 1.4 10*3/uL (ref 0.7–4.0)
Lymphocytes Relative: 23 %
MCH: 32.1 pg (ref 26.0–34.0)
MCHC: 34.1 g/dL (ref 30.0–36.0)
MCV: 94 fL (ref 80.0–100.0)
MONOS PCT: 8 %
Monocytes Absolute: 0.5 10*3/uL (ref 0.1–1.0)
NEUTROS ABS: 4 10*3/uL (ref 1.7–7.7)
NEUTROS PCT: 66 %
NRBC: 0 % (ref 0.0–0.2)
PLATELETS: 234 10*3/uL (ref 150–400)
RBC: 4.49 MIL/uL (ref 4.22–5.81)
RDW: 12.4 % (ref 11.5–15.5)
WBC: 6.1 10*3/uL (ref 4.0–10.5)

## 2018-08-07 LAB — COMPREHENSIVE METABOLIC PANEL
ALBUMIN: 4.2 g/dL (ref 3.5–5.0)
ALT: 39 U/L (ref 0–44)
ANION GAP: 8 (ref 5–15)
AST: 42 U/L — AB (ref 15–41)
Alkaline Phosphatase: 70 U/L (ref 38–126)
BUN: 19 mg/dL (ref 8–23)
CHLORIDE: 106 mmol/L (ref 98–111)
CO2: 24 mmol/L (ref 22–32)
Calcium: 9.1 mg/dL (ref 8.9–10.3)
Creatinine, Ser: 1.2 mg/dL (ref 0.61–1.24)
GFR calc Af Amer: >60 mL/min (ref >60)
GFR calc non Af Amer: >60 mL/min (ref >60)
GLUCOSE: 67 mg/dL — AB (ref 70–99)
POTASSIUM: 3.9 mmol/L (ref 3.5–5.1)
SODIUM: 138 mmol/L (ref 135–145)
Total Bilirubin: 0.7 mg/dL (ref 0.3–1.2)
Total Protein: 7.2 g/dL (ref 6.5–8.1)

## 2018-08-07 LAB — MAGNESIUM: MAGNESIUM: 2.1 mg/dL (ref 1.7–2.4)

## 2018-08-07 NOTE — Telephone Encounter (Signed)
Gave avs and calendar ° °

## 2018-10-12 DIAGNOSIS — Z85818 Personal history of malignant neoplasm of other sites of lip, oral cavity, and pharynx: Secondary | ICD-10-CM | POA: Diagnosis not present

## 2018-10-12 DIAGNOSIS — Z87891 Personal history of nicotine dependence: Secondary | ICD-10-CM | POA: Diagnosis not present

## 2018-10-12 DIAGNOSIS — C099 Malignant neoplasm of tonsil, unspecified: Secondary | ICD-10-CM | POA: Diagnosis not present

## 2018-11-17 ENCOUNTER — Telehealth: Payer: Self-pay | Admitting: *Deleted

## 2018-11-17 NOTE — Telephone Encounter (Signed)
Called patient to remind of lab for 11-18-18 @ 3:15 pm, spoke with patient's wife- [name] and she is aware of this appt.

## 2018-11-18 ENCOUNTER — Ambulatory Visit: Payer: Medicare Other

## 2018-11-18 ENCOUNTER — Ambulatory Visit
Admission: RE | Admit: 2018-11-18 | Discharge: 2018-11-18 | Disposition: A | Discharge status: Home / Self Care | Payer: Medicare Other | Source: Ambulatory Visit | Attending: Radiation Oncology | Admitting: Radiation Oncology

## 2018-11-18 ENCOUNTER — Telehealth: Payer: Self-pay | Admitting: *Deleted

## 2018-11-18 NOTE — Progress Notes (Signed)
I called the patient today about their upcoming follow-up appointment in radiation oncology.   Given concerns about the COVID-19 pandemic, I offered a phone assessment with the patient to determine if coming to the clinic was necessary. The patient accepted.  I let the patient know that I had spoken with Dr. Isidore Moos, and she wanted them to know the importance of washing their hands for at least 20 seconds at a time, especially after going out in public, and before they eat. Limit going out in public whenever possible. Do not touch your face, unless your hands are clean, such as when bathing. Get plenty of rest, eat well, and stay hydrated.   Symptomatically, the patient is doing relatively well. They report no concerns at this time related to previous radiation therapy  All questions were answered to the patient's satisfaction.  I encouraged the patient to call with any further questions. Otherwise, the plan is to follow up in mid August with labs and and appointment with Dr. Isidore Moos.    Patient is pleased with this plan, and we will cancel their upcoming follow-up to reduce the risk of COVID-19 transmission.

## 2018-11-18 NOTE — Telephone Encounter (Signed)
CALLED PATIENT TO INFORM OF LAB AND FU BEING MOVED TO 01-15-19 - LAB @ 10:45 AM AND FU WITH DR. Isidore Moos ON 01-15-19 @ 11 AM, SPOKE WITH PATIENT'S WIFE- DEBBIE AND SHE IS AWARE OF THESE APPTS. AND SHE IS GOOD WITH THEM

## 2019-01-07 NOTE — Progress Notes (Signed)
Tracy [name] presents for follow up of radiation completed 11/06/18 to his left tonsil bed and bilateral neck.   Pain issues, if any: He denies.  Using a feeding tube?: No Weight changes, if any:  Wt Readings from Last 3 Encounters:  01/15/19 185 lb (83.9 kg)  08/07/18 179 lb 12.8 oz (81.6 kg)  05/15/18 178 lb 9.6 oz (81 kg)   Swallowing issues, if any: He denies. He does report a dry mouth while sleeping. He continues to have mild taste changes.  Smoking or chewing tobacco? No Using fluoride trays daily? He is not using.  Last ENT visit was on: Dr. Conley Canal 10/12/18 Other notable issues, if any:  He reports stiffness to his neck, especially after working all day.  Dr. Irene Limbo on 08/07/18, next appointment 02/09/19   BP 121/78 (BP Location: Right Arm)   Pulse (!) 59   Temp 98.7 F (37.1 C) (Oral)   Wt 185 lb (83.9 kg)   SpO2 99% Comment: room air  BMI 27.32 kg/m

## 2019-01-14 NOTE — Progress Notes (Signed)
Radiation Oncology         [phone] ________________________________  Name: Tracy [name] MRN: [id number]  Date: 01/15/2019  DOB: 11-Jul-1950  Follow-Up Visit Note in person, outpatient  CC: [name], MD  [name], *  Diagnosis and Prior Radiotherapy:       ICD-10-CM   1. Cancer of tonsillar fossa ([place])  C09.0     CHIEF COMPLAINT:  Here for follow-up and surveillance of his tonsillar fossa cancer  Narrative:  The patient returns today for routine follow-up. Doing well. Swallowing issues, if any: He denies. He does report a dry mouth while sleeping. He continues to have mild taste changes.  Smoking or chewing tobacco? No Using fluoride trays daily? He is not using.  Last ENT visit was on: Dr. Conley Canal 10/12/18 Other notable issues, if any:  He reports stiffness to his neck, especially after working all day.  Dr. Irene Limbo on 08/07/18, next appointment 02/09/19 to review CT chest results    ALLERGIES:  is allergic to bismuth subsalicylate.  Meds: Current Outpatient Medications  Medication Sig Dispense Refill  . insulin NPH-regular Human (NOVOLIN 70/30) (70-30) 100 UNIT/ML injection 20 Units.     Marland Kitchen latanoprost (XALATAN) 0.005 % ophthalmic solution 1 drop at bedtime.    . timolol (TIMOPTIC) 0.5 % ophthalmic solution     . atorvastatin (LIPITOR) 10 MG tablet TAKE 1 TABLET BY MOUTH ONCE A WEEK FOR HIGH CHOLESTEROL  3   No current facility-administered medications for this encounter.     Physical Findings: The patient is in no acute distress. Patient is alert and oriented. Wt Readings from Last 3 Encounters:  01/15/19 185 lb (83.9 kg)  08/07/18 179 lb 12.8 oz (81.6 kg)  05/15/18 178 lb 9.6 oz (81 kg)    weight is 185 lb (83.9 kg). His oral temperature is 98.7 F (37.1 C). His blood pressure is 121/78 and his pulse is 59 (abnormal). His oxygen saturation is 99%. .  General: Alert and oriented, in no acute distress HEENT: Head is normocephalic. Extraocular  movements are intact. Oropharynx/ mouth notable for no sign of tumor  Neck: Neck is notable for mild fibrosis and edema, no masses Skin: Skin in treatment fields shows satisfactory healing  Extremities: No cyanosis or edema. Lymphatics: see Neck Exam Psychiatric: Judgment and insight are intact. Affect is appropriate.   Lab Findings: Lab Results  Component Value Date   WBC 6.1 08/07/2018   HGB 14.4 08/07/2018   HCT 42.2 08/07/2018   MCV 94.0 08/07/2018   PLT 234 08/07/2018    Lab Results  Component Value Date   TSH 1.652 05/08/2018    Radiographic Findings: No results found.  Impression/Plan:    1) Head and Neck Cancer Status: NED  2) Nutritional Status: good PEG tube: none  3) Risk Factors: The patient has been educated about risk factors including alcohol and tobacco abuse; they understand that avoidance of alcohol and tobacco is important to prevent recurrences as well as other cancers  4) Swallowing: good  5) Dental: Encouraged to continue regular followup with dentistry, and dental hygiene including fluoride rinses. He will use Prevident 5000.  6) Thyroid function:  Lab Results  Component Value Date   TSH 1.652 05/08/2018  rechecking today  7) Other: f/u with Dr Irene Limbo for CT chest results in Sept; I sent him a note that this can be done via telemedicine if he wishes  Continue f/u w/ ENT.  8) Follow-up in 8 months. The patient  was encouraged to call with any issues or questions before then.  I spent 15 minutes face to face with the patient and more than 50% of that time was spent in counseling and/or coordination of care. _____________________________________   [name], MD

## 2019-01-15 ENCOUNTER — Other Ambulatory Visit: Payer: Self-pay

## 2019-01-15 ENCOUNTER — Encounter: Payer: Self-pay | Admitting: Radiation Oncology

## 2019-01-15 ENCOUNTER — Ambulatory Visit
Admission: RE | Admit: 2019-01-15 | Discharge: 2019-01-15 | Disposition: A | Discharge status: Home / Self Care | Payer: Medicare Other | Source: Ambulatory Visit | Attending: Radiation Oncology | Admitting: Radiation Oncology

## 2019-01-15 ENCOUNTER — Telehealth: Payer: Self-pay

## 2019-01-15 DIAGNOSIS — R682 Dry mouth, unspecified: Secondary | ICD-10-CM | POA: Insufficient documentation

## 2019-01-15 DIAGNOSIS — Z79899 Other long term (current) drug therapy: Secondary | ICD-10-CM | POA: Insufficient documentation

## 2019-01-15 DIAGNOSIS — C090 Malignant neoplasm of tonsillar fossa: Secondary | ICD-10-CM

## 2019-01-15 DIAGNOSIS — Z85819 Personal history of malignant neoplasm of unspecified site of lip, oral cavity, and pharynx: Secondary | ICD-10-CM | POA: Insufficient documentation

## 2019-01-15 DIAGNOSIS — Z1329 Encounter for screening for other suspected endocrine disorder: Secondary | ICD-10-CM

## 2019-01-15 DIAGNOSIS — Z923 Personal history of irradiation: Secondary | ICD-10-CM | POA: Insufficient documentation

## 2019-01-15 DIAGNOSIS — E039 Hypothyroidism, unspecified: Secondary | ICD-10-CM | POA: Insufficient documentation

## 2019-01-15 DIAGNOSIS — Z08 Encounter for follow-up examination after completed treatment for malignant neoplasm: Secondary | ICD-10-CM | POA: Diagnosis not present

## 2019-01-15 DIAGNOSIS — Z794 Long term (current) use of insulin: Secondary | ICD-10-CM | POA: Insufficient documentation

## 2019-01-15 LAB — TSH: TSH: 2.048 u[IU]/mL (ref 0.320–4.118)

## 2019-01-15 NOTE — Telephone Encounter (Signed)
I called Tracy [name] at the request of Dr. Isidore Moos to inform him that his thyroid lab levels remain normal. I spoke to his wife and informed her. She voiced appreciation for the phone call and knows to call me if he has any questions or concerns.

## 2019-01-28 IMAGING — CT CT NECK W/ CM
2 of 4 series · 6 of 14 positions shown, 7 images · IV contrast (iopamidol)
Comparison: None.

CLINICAL DATA: 66-year-old male with left submandibular region neck
mass discovered 1 month ago. Percutaneous biopsy 2 weeks ago
positive for malignancy.

Creatinine was obtained on site at [HOSPITAL] at [HOSPITAL].
Results: Creatinine 1.0 mg/dL.
EXAM:
CT NECK WITH CONTRAST
TECHNIQUE: Multidetector CT imaging of the neck was performed using the
standard protocol following the bolus administration of intravenous
contrast.
CONTRAST:  75mL [id number] IOPAMIDOL ([id number]) INJECTION 61%

[Series 3: neck · axial · 0.47mm/px · z∈[+915,+1047]mm · 3 of 133 slices shown, 4 images]
[im 34/133  soft-tissue]
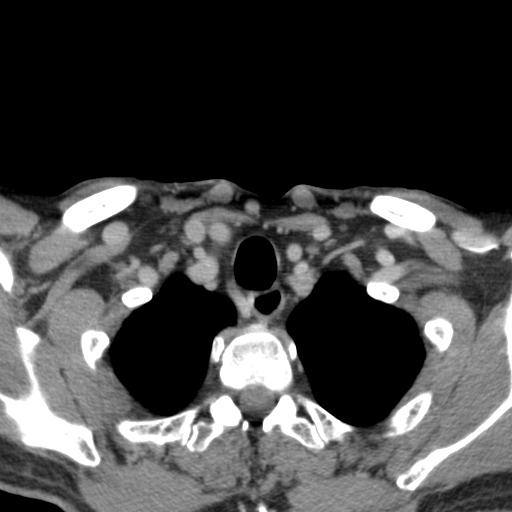
[im 34/133  bone]
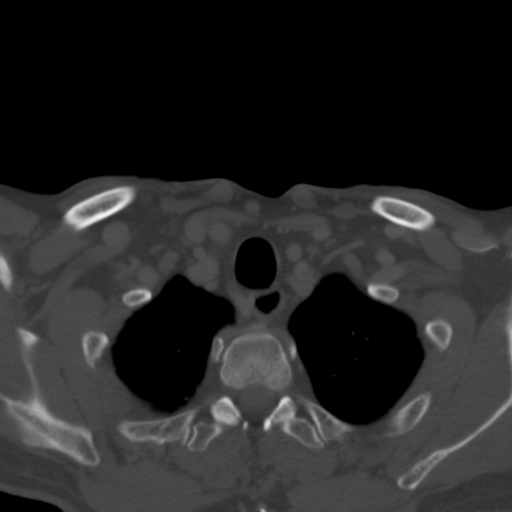
[im 67/133  bone]
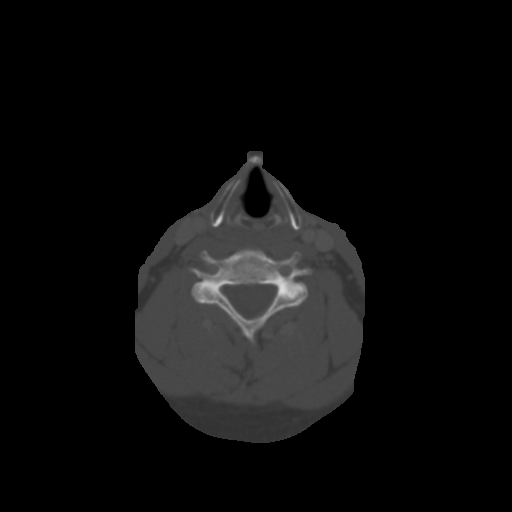
[im 100/133  bone]
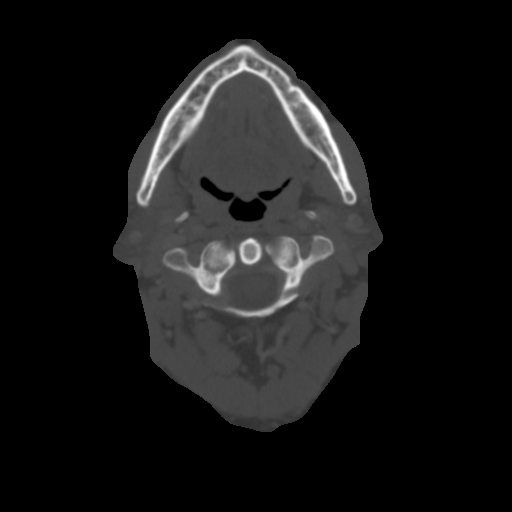

[Series 7: angled axial-oropharynx · axial · 0.39mm/px · z∈[+891,+1037]mm · 3 of 150 slices shown]
[im 38/150  bone]
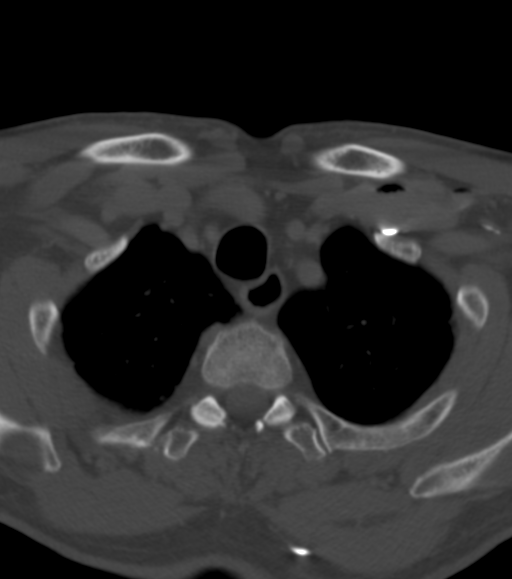
[im 75/150  bone]
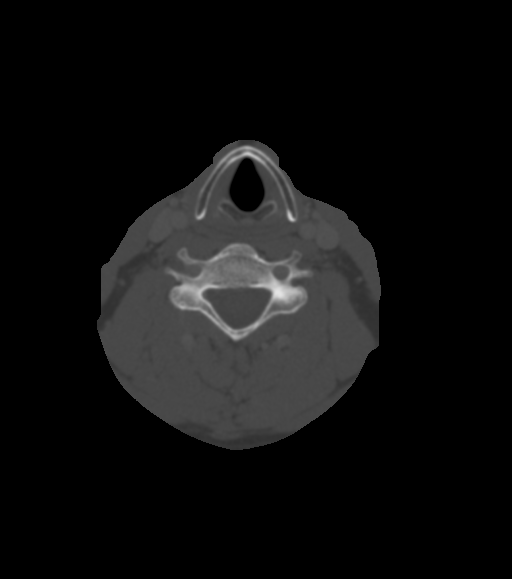
[im 112/150  bone]
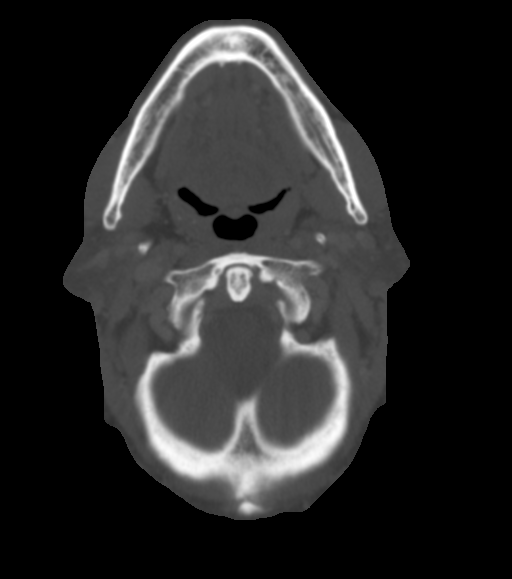

[6 of 14 positions shown; findings below may reference images not displayed]

FINDINGS: Pharynx and larynx: Normal larynx.  Normal hypopharynx.

Palatine tonsils are mildly lobulated but appear symmetric and
within normal limits. There are right greater than left
postinflammatory calcifications of the tonsillar pillars.

The nasopharynx appears normal. Negative parapharyngeal and
retropharyngeal spaces.

Salivary glands: Sublingual space, right submandibular gland and
parotid glands are normal. The left parotid gland is normal aside
from being abutted by an abnormal left level IIa lymph node,
detailed below.

Thyroid: Negative.

Lymph nodes: Solitary enlarged, rounded, and mildly heterogeneous
lymph node centered at the left IIa station measures 20 x 20 x 43 mm
(AP by transverse by CC), and corresponds to the marked area of
clinical concern. No internal cystic or necrotic change. Other left
level 2 nodes are diminutive, but are asymmetrically increased
compared to right level 2 nodes (series 3, image 45).

Nearby left level 1 B nodes appear symmetric and normal.

Elongated but 7 mm short axis left level 3 nodal tissue is also
asymmetrically increased (series 3, image 74 and sagittal image 69.

Level 4 nodes appear symmetric and within normal limits.

Vascular: Major vascular structures in the neck and at the skullbase
are patent including the left internal jugular vein. Mild left
greater than right carotid bifurcation atherosclerosis.

Limited intracranial: Negative.

Visualized orbits: Not included.

Mastoids and visualized paranasal sinuses: Clear.

Skeleton: Elongation of bilateral stylohyoid ligament calcification.
No acute or suspicious osseous lesion identified in the neck or at
the skullbase.

Upper chest: Tiny 1-2 mm apical nodules on the right appear to be
postinflammatory. Otherwise negative lung apices. 6 mm short axis
right peritracheal lymph node at the thoracic inlet is within normal
limits. Other visible mediastinal nodes are normal.
IMPRESSION: 1. Abnormally enlarged, solid left level 2 lymph node up to 4.3 cm
corresponds to the palpable area of concern.
Other left level 2 and level 3 nodes are normal by size criteria but
asymmetrically increased. No contralateral lymphadenopathy. No
primary tumor identified.
2. Favor this is metastatic nodal disease from an occult
pharyngeal/laryngeal primary. Less likely differential
considerations are lymphoproliferative disorder, lymphoma, or
metastatic disease from a regional skin cancer or a distant primary.
3. Tiny right apical lung nodules appear postinflammatory.

## 2019-02-08 NOTE — Progress Notes (Signed)
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 male who has been referred to [place] by Dr. [name], his ENT, for evaluation and management of Squamous Cell Carcinoma of the head and Neck. The patient's last visit with [place] was on 08/07/2018. The pt reports that he is doing well overall.  The pt reports that his energy levels have been good. He hasn't been experiencing any issues with his throat. No changes in voice, breathing or swallowing. Pt has been eating well and his weight has returned close to his baseline.   Lab results today (02/09/19) of CBC w/diff and CMP is as follows: all values are WNL except for Glucose at 155, Total Bilirubin at <0.2. 02/09/2019 TSH at 2.066  On review of systems, pt reports increased energy and denies fatigue, breathing issues, changes in voice, issues swallowing, abdominal pain and any other symptoms.   MEDICAL HISTORY:  Past Medical History:  Diagnosis Date  . Diabetes mellitus ([place])   . Glaucoma   . History of radiation therapy 09/24/17- 11/05/17   head and neck, Left tonsil/ 60 gy delivered in 30 fractions of 2 Gy.   . Kidney stones 1985  . Neck mass 12/24/2016    SURGICAL HISTORY: Past Surgical History:  Procedure Laterality Date  . 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 FINE NEEDLE ASPIRATION  12/25/2016   Left neck, Level 2  . NASAL SEPTUM SURGERY     remotely  . TOE SURGERY     broken toe with bone ground down.     SOCIAL HISTORY:  Social History   Socioeconomic History  . Marital status: Married    Spouse name: [name]  . Number of children: 5  . Years of education: Not on file  . Highest education level: Not on file  Occupational History  . Not on file  Social Needs  . Financial resource strain: Not on file  . Food insecurity    Worry: Not on file    Inability: Not on file  . Transportation needs    Medical: No    Non-medical: No  Tobacco Use  . Smoking status: Former Smoker    Packs/day: 1.00    Years: 10.00    Pack years: 10.00    Types: Cigarettes    Quit date: 06/03/1985    Years since quitting: 33.7  . Smokeless tobacco: Former Systems developer    Types: [place] date: 06/03/1985  Substance and Sexual Activity  . Alcohol use: No  . Drug use: No  . Sexual activity: Not on file  Lifestyle  . Physical activity    Days per week: Not on file    Minutes per session: Not on file  . Stress: Not on file  Relationships  . Social Herbalist on phone: Not on file    Gets together: Not on file    Attends religious service: Not on file    Active member of club or organization: Not on file  Attends meetings of clubs or organizations: Not on file    Relationship status: Not on file  . Intimate partner violence    Fear of current or ex partner: No    Emotionally abused: No    Physically abused: No    Forced sexual activity: No  Other Topics Concern  . Not on file  Social History Narrative  . Not on file    FAMILY HISTORY: His mother had mucosal melanoma and father had prostate cancer. His sister had stage 4 pancreatic cancer.  His mother and father had DM.  His maternal grandfather had leukemia.  His maternal uncle had brain cancer.   ALLERGIES:  is allergic to bismuth subsalicylate.  MEDICATIONS:  Current Outpatient Medications  Medication Sig Dispense Refill  . atorvastatin (LIPITOR) 10 MG tablet TAKE 1 TABLET BY MOUTH ONCE A WEEK FOR HIGH CHOLESTEROL  3  . insulin NPH-regular Human  (NOVOLIN 70/30) (70-30) 100 UNIT/ML injection 20 Units.     Marland Kitchen latanoprost (XALATAN) 0.005 % ophthalmic solution 1 drop at bedtime.    . timolol (TIMOPTIC) 0.5 % ophthalmic solution      No current facility-administered medications for this visit.     REVIEW OF SYSTEMS:    A 10+ POINT REVIEW OF SYSTEMS WAS OBTAINED including neurology, dermatology, psychiatry, cardiac, respiratory, lymph, extremities, GI, GU, Musculoskeletal, constitutional, breasts, reproductive, HEENT.  All pertinent positives are noted in the HPI.  All others are negative.   PHYSICAL EXAMINATION: ECOG PERFORMANCE STATUS: 1 - Symptomatic but completely ambulatory  .BP (!) 105/92 (BP Location: Left Arm, Patient Position: Sitting)   Pulse 74   Temp 99.1 F (37.3 C) (Temporal)   Resp 18   Ht 5\' 9"  (1.753 m)   Wt 187 lb (84.8 kg)   SpO2 100%   BMI 27.62 kg/m   GENERAL:alert, in no acute distress and comfortable SKIN: no acute rashes, no significant lesions EYES: conjunctiva are pink and non-injected, sclera anicteric OROPHARYNX: MMM, no exudates, no oropharyngeal erythema or ulceration NECK: supple, no JVD LYMPH:  no palpable lymphadenopathy in the cervical, axillary or inguinal regions LUNGS: clear to auscultation b/l with normal respiratory effort HEART: regular rate & rhythm ABDOMEN:  normoactive bowel sounds , non tender, not distended. No palpable hepatosplenomegaly.  Extremity: no pedal edema PSYCH: alert & oriented x 3 with fluent speech NEURO: no focal motor/sensory deficits  LABORATORY DATA:  I have reviewed the data as listed  . CBC Latest Ref Rng & Units 02/09/2019 08/07/2018 05/08/2018  WBC 4.0 - 10.5 K/uL 4.6 6.1 4.7  Hemoglobin 13.0 - 17.0 g/dL 14.4 14.4 14.1  Hematocrit 39.0 - 52.0 % 41.8 42.2 41.3  Platelets 150 - 400 K/uL 233 234 229    . CMP Latest Ref Rng & Units 02/09/2019 08/07/2018 05/08/2018  Glucose 70 - 99 mg/dL 155(H) 67(L) 61(L)  BUN 8 - 23 mg/dL 18 19 15   Creatinine 0.61 - 1.24  mg/dL 1.19 1.20 1.18  Sodium 135 - 145 mmol/L 139 138 141  Potassium 3.5 - 5.1 mmol/L 4.0 3.9 4.4  Chloride 98 - 111 mmol/L 107 106 106  CO2 22 - 32 mmol/L 23 24 26   Calcium 8.9 - 10.3 mg/dL 9.2 9.1 9.5  Total Protein 6.5 - 8.1 g/dL 7.0 7.2 7.2  Total Bilirubin 0.3 - 1.2 mg/dL <0.2(L) 0.7 0.3  Alkaline Phos 38 - 126 U/L 76 70 80  AST 15 - 41 U/L 25 42(H) 26  ALT 0 - 44 U/L 26 39 22  TSH 2.066  PATHOLOGY   Diagnosis 12/25/16   CONSULT SLIDE, LEFT NECK LEVEL 2 MALIGNANT CELLS PRESENT, CONSISTENT WITH SQUAMOUS CELL CARCINOMA. 04/16/2017  03/17/2017  02/20/2017   Surgical Pathology Tissue ExamResulted: 08/19/2017 4:44 PM [facility] Specimen Collected on  Lymph Node 08/15/2017 9:02 PM  Result Narrative    ACCESSION [id number] RECEIVED: 08/15/2017 ORDERING PHYSICIAN:CHRISTOPHER A SULLIVAN , MD PATIENT NAME:Vierling, Perrin [facility] SURGICAL PATHOLOGY REPORT  FINAL PATHOLOGIC DIAGNOSIS MICROSCOPIC EXAMINATION AND DIAGNOSIS   "LEFT LEVEL 3&5 LYMPH NODE", EXCISON: Metastatic squamous cell carcinoma involving one of eight lymph nodes (1/8).    I have personally reviewed the slides and/or other related materials referenced, and have edited the report as part of my pathologic assessment and final interpretation.  Electronically Signed Out By: Sherlon Handing , MD 08/19/2017 16:44:05    RADIOGRAPHIC STUDIES: I have personally reviewed the radiological images as listed and agreed with the findings in the report.  PET/CT 07/16/2017 PET HEAD AND NECK CANCER (W/LOW DOSE CT)07/16/2017 [facility] Result Impression   1.Hypermetabolic left level III cervical lymph node consistent with metastatic disease.  2.Additional nonhypermetabolic subcentimeter left level V cervical lymph nodes are also noted. They are of questionable significance  3.Focal area of increased uptake in the posterior left liver, of  questionable significance. Recommend MRI of the liver for further evaluation. 4.Apparent bladder wall thickening which may in part be due to underdistention. Consider urinalysis and urology consult if clinically indicated. 5.Ancillary CT findings as above.    PET 01/14/17 IMPRESSION: 1. Focal area of FDG uptake in the left lateral pharynx/upper left tonsillar region likely representing the primary neoplasm. 2. Metastatic hypermetabolic left level 2 neck nodes. No other FDG positive nodal stations or contralateral adenopathy. 3. No findings for metastatic disease.   CT Soft Tissue W Contrast 01/03/17 IMPRESSION: 1. Abnormally enlarged, solid left level 2 lymph node up to 4.3 cm corresponds to the palpable area of concern. Other left level 2 and level 3 nodes are normal by size criteria but asymmetrically increased. No contralateral lymphadenopathy. No primary tumor identified. 2. Burtis Junes this is metastatic nodal disease from an occult pharyngeal/laryngeal primary. Less likely differential considerations are lymphoproliferative disorder, lymphoma, or metastatic disease from a regional skin cancer or a distant primary. 3. Tiny right apical lung nodules appear postinflammatory.   ASSESSMENT & PLAN:   MAKAY SCHEIDT is a 68 y.o. 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 Only 10pk-yr h/o smoking and quit in 1982 patient is s/p TORS surgery to confirm that the left tonsils/BOT area is the primary lesion as suggested by PET/CT . -he has subsequently had radical lymph node dissection of his neck which shows 1 out of 21 lymph nodes with metastatic disease 5.1 cm without ENE. --patient had rpt PET/CT on 07/16/2017 which showed  hypermetabolic left level III cervical lymph node consistent with metastatic disease. Additional nonhypermetabolic  subcentimeter left level V cervical lymph nodes are also noted. They are of questionable significance. Focal area of increased uptake in the posterior left liver, of questionable significance - on 08/15/2017 by Dr Conley Canal yielded metastatic squamous cell carcinoma in 1 out of 8 lymph nodes. -Completed adjuvant IMRT with Dr Isidore Moos  02/05/18 PET/CT which revealed  Resolution hypermetabolic LEFT level 2 lymph node. No 2 no hypermetabolic lymph nodes in the neck.  2. No residual hypermetabolic activity in the hypopharynx. 3. Single new RIGHT upper lobe pulmonary nodule with mild metabolic activity is indeterminate. Recommend short-term CT follow-up to demonstrate stability/resolution or growth.   PLAN: -Discussed pt labwork today, 02/09/19; all values are WNL except for Glucose at 155, Total Bilirubin at <0.2. -Discussed 02/09/2019 TSH at 2.066 -The pt shows no clinical or lab progression/return of his squamous cell carcinoma at this time. -No indication for further treatment at this time.   -Continue every 3 month local evaluations with ENT Dr. Conley Canal  -If everything looks normal in 6 months will move to see pt back annually  -Advised pt to contact us or ENT physicians if any symptoms arise -Will see the pt back in 6 months with labs   #3 Diabetes Plan -recommended drinking atleast 60OZ of water daily -close f/u with PCP to optimize DM2 management. -Recommend that PCP consider basal insulin like Lantus as opposed to NPH   FOLLOW UP: RTC with Dr Irene Limbo with labs in 6 months  The total time spent in the appt was 15 minutes and more than 50% was on counseling and direct patient cares.  All of the patient's questions were answered with apparent satisfaction. The patient knows to call the clinic with any problems, questions or concerns.    Sullivan Lone MD [place] AAHIVMS [facility] Hematology/Oncology Physician [facility]  (Office):       [phone] (Work cell):  [phone] (Fax):            [phone]  02/09/2019 4:00 PM  I, Yevette Edwards, am acting as a scribe for Dr. Sullivan Lone.   .I have reviewed the above documentation for accuracy and completeness, and I agree with the above. Brunetta Genera MD

## 2019-02-09 ENCOUNTER — Other Ambulatory Visit: Payer: Self-pay

## 2019-02-09 ENCOUNTER — Inpatient Hospital Stay: Payer: Medicare Other

## 2019-02-09 ENCOUNTER — Inpatient Hospital Stay: Payer: Medicare Other | Attending: Hematology | Admitting: Hematology

## 2019-02-09 VITALS — BP 105/92 | HR 74 | Temp 99.1°F | Resp 18 | Ht 69.0 in | Wt 187.0 lb

## 2019-02-09 DIAGNOSIS — E079 Disorder of thyroid, unspecified: Secondary | ICD-10-CM | POA: Insufficient documentation

## 2019-02-09 DIAGNOSIS — Z87891 Personal history of nicotine dependence: Secondary | ICD-10-CM | POA: Diagnosis not present

## 2019-02-09 DIAGNOSIS — E785 Hyperlipidemia, unspecified: Secondary | ICD-10-CM | POA: Insufficient documentation

## 2019-02-09 DIAGNOSIS — C090 Malignant neoplasm of tonsillar fossa: Secondary | ICD-10-CM | POA: Insufficient documentation

## 2019-02-09 DIAGNOSIS — Z923 Personal history of irradiation: Secondary | ICD-10-CM

## 2019-02-09 DIAGNOSIS — E119 Type 2 diabetes mellitus without complications: Secondary | ICD-10-CM | POA: Insufficient documentation

## 2019-02-09 DIAGNOSIS — C760 Malignant neoplasm of head, face and neck: Secondary | ICD-10-CM

## 2019-02-09 DIAGNOSIS — Z79899 Other long term (current) drug therapy: Secondary | ICD-10-CM | POA: Insufficient documentation

## 2019-02-09 DIAGNOSIS — Z794 Long term (current) use of insulin: Secondary | ICD-10-CM | POA: Insufficient documentation

## 2019-02-09 LAB — CBC WITH DIFFERENTIAL/PLATELET
Abs Immature Granulocytes: 0.01 10*3/uL (ref 0.00–0.07)
Basophils Absolute: 0 10*3/uL (ref 0.0–0.1)
Basophils Relative: 0 %
Eosinophils Absolute: 0.2 10*3/uL (ref 0.0–0.5)
Eosinophils Relative: 5 %
HCT: 41.8 % (ref 39.0–52.0)
Hemoglobin: 14.4 g/dL (ref 13.0–17.0)
Immature Granulocytes: 0 %
Lymphocytes Relative: 21 %
Lymphs Abs: 1 10*3/uL (ref 0.7–4.0)
MCH: 31.6 pg (ref 26.0–34.0)
MCHC: 34.4 g/dL (ref 30.0–36.0)
MCV: 91.7 fL (ref 80.0–100.0)
Monocytes Absolute: 0.4 10*3/uL (ref 0.1–1.0)
Monocytes Relative: 9 %
Neutro Abs: 3 10*3/uL (ref 1.7–7.7)
Neutrophils Relative %: 65 %
Platelets: 233 10*3/uL (ref 150–400)
RBC: 4.56 MIL/uL (ref 4.22–5.81)
RDW: 12.4 % (ref 11.5–15.5)
WBC: 4.6 10*3/uL (ref 4.0–10.5)
nRBC: 0 % (ref 0.0–0.2)

## 2019-02-09 LAB — CMP (CANCER CENTER ONLY)
ALT: 26 U/L (ref 0–44)
AST: 25 U/L (ref 15–41)
Albumin: 4 g/dL (ref 3.5–5.0)
Alkaline Phosphatase: 76 U/L (ref 38–126)
Anion gap: 9 (ref 5–15)
BUN: 18 mg/dL (ref 8–23)
CO2: 23 mmol/L (ref 22–32)
Calcium: 9.2 mg/dL (ref 8.9–10.3)
Chloride: 107 mmol/L (ref 98–111)
Creatinine: 1.19 mg/dL (ref 0.61–1.24)
GFR, Est AFR Am: >60 mL/min (ref >60)
GFR, Estimated: >60 mL/min (ref >60)
Glucose, Bld: 155 mg/dL — ABNORMAL HIGH (ref 70–99)
Potassium: 4 mmol/L (ref 3.5–5.1)
Sodium: 139 mmol/L (ref 135–145)
Total Bilirubin: <0.2 mg/dL — ABNORMAL LOW (ref 0.3–1.2)
Total Protein: 7 g/dL (ref 6.5–8.1)

## 2019-02-09 LAB — TSH: TSH: 2.066 u[IU]/mL (ref 0.320–4.118)

## 2019-02-10 ENCOUNTER — Telehealth: Payer: Self-pay | Admitting: Hematology

## 2019-02-10 NOTE — Telephone Encounter (Signed)
Scheduled appt per 9/8 los. A calendar will be mailed out.

## 2019-02-15 DIAGNOSIS — C770 Secondary and unspecified malignant neoplasm of lymph nodes of head, face and neck: Secondary | ICD-10-CM | POA: Diagnosis not present

## 2019-02-18 DIAGNOSIS — E1165 Type 2 diabetes mellitus with hyperglycemia: Secondary | ICD-10-CM | POA: Diagnosis not present

## 2019-02-18 DIAGNOSIS — Z6825 Body mass index (BMI) 25.0-25.9, adult: Secondary | ICD-10-CM | POA: Diagnosis not present

## 2019-02-18 DIAGNOSIS — I10 Essential (primary) hypertension: Secondary | ICD-10-CM | POA: Diagnosis not present

## 2019-02-18 DIAGNOSIS — Z23 Encounter for immunization: Secondary | ICD-10-CM | POA: Diagnosis not present

## 2019-02-18 DIAGNOSIS — E782 Mixed hyperlipidemia: Secondary | ICD-10-CM | POA: Diagnosis not present

## 2019-04-05 DIAGNOSIS — H401121 Primary open-angle glaucoma, left eye, mild stage: Secondary | ICD-10-CM | POA: Diagnosis not present

## 2019-04-05 DIAGNOSIS — H401120 Primary open-angle glaucoma, left eye, stage unspecified: Secondary | ICD-10-CM | POA: Diagnosis not present

## 2019-04-26 ENCOUNTER — Other Ambulatory Visit: Payer: Self-pay

## 2019-06-21 DIAGNOSIS — E1165 Type 2 diabetes mellitus with hyperglycemia: Secondary | ICD-10-CM | POA: Diagnosis not present

## 2019-06-21 DIAGNOSIS — Z6826 Body mass index (BMI) 26.0-26.9, adult: Secondary | ICD-10-CM | POA: Diagnosis not present

## 2019-06-21 DIAGNOSIS — I10 Essential (primary) hypertension: Secondary | ICD-10-CM | POA: Diagnosis not present

## 2019-06-21 DIAGNOSIS — E782 Mixed hyperlipidemia: Secondary | ICD-10-CM | POA: Diagnosis not present

## 2019-08-09 ENCOUNTER — Other Ambulatory Visit: Payer: Self-pay

## 2019-08-09 ENCOUNTER — Inpatient Hospital Stay (HOSPITAL_BASED_OUTPATIENT_CLINIC_OR_DEPARTMENT_OTHER): Payer: Medicare Other | Admitting: Hematology

## 2019-08-09 ENCOUNTER — Inpatient Hospital Stay: Payer: Medicare Other | Attending: Hematology

## 2019-08-09 VITALS — BP 126/82 | HR 63 | Temp 98.3°F | Resp 17 | Ht 69.0 in | Wt 192.3 lb

## 2019-08-09 DIAGNOSIS — Z800 Family history of malignant neoplasm of digestive organs: Secondary | ICD-10-CM | POA: Insufficient documentation

## 2019-08-09 DIAGNOSIS — E079 Disorder of thyroid, unspecified: Secondary | ICD-10-CM

## 2019-08-09 DIAGNOSIS — B977 Papillomavirus as the cause of diseases classified elsewhere: Secondary | ICD-10-CM | POA: Diagnosis not present

## 2019-08-09 DIAGNOSIS — E119 Type 2 diabetes mellitus without complications: Secondary | ICD-10-CM | POA: Diagnosis not present

## 2019-08-09 DIAGNOSIS — C090 Malignant neoplasm of tonsillar fossa: Secondary | ICD-10-CM

## 2019-08-09 DIAGNOSIS — Z794 Long term (current) use of insulin: Secondary | ICD-10-CM | POA: Insufficient documentation

## 2019-08-09 DIAGNOSIS — Z8042 Family history of malignant neoplasm of prostate: Secondary | ICD-10-CM | POA: Diagnosis not present

## 2019-08-09 DIAGNOSIS — Z808 Family history of malignant neoplasm of other organs or systems: Secondary | ICD-10-CM | POA: Diagnosis not present

## 2019-08-09 DIAGNOSIS — C760 Malignant neoplasm of head, face and neck: Secondary | ICD-10-CM

## 2019-08-09 DIAGNOSIS — Z87891 Personal history of nicotine dependence: Secondary | ICD-10-CM | POA: Diagnosis not present

## 2019-08-09 DIAGNOSIS — Z806 Family history of leukemia: Secondary | ICD-10-CM | POA: Diagnosis not present

## 2019-08-09 DIAGNOSIS — C770 Secondary and unspecified malignant neoplasm of lymph nodes of head, face and neck: Secondary | ICD-10-CM | POA: Insufficient documentation

## 2019-08-09 DIAGNOSIS — Z923 Personal history of irradiation: Secondary | ICD-10-CM | POA: Insufficient documentation

## 2019-08-09 LAB — CBC WITH DIFFERENTIAL/PLATELET
Abs Immature Granulocytes: 0.02 10*3/uL (ref 0.00–0.07)
Basophils Absolute: 0 10*3/uL (ref 0.0–0.1)
Basophils Relative: 1 %
Eosinophils Absolute: 0.1 10*3/uL (ref 0.0–0.5)
Eosinophils Relative: 2 %
HCT: 44.1 % (ref 39.0–52.0)
Hemoglobin: 15.1 g/dL (ref 13.0–17.0)
Immature Granulocytes: 1 %
Lymphocytes Relative: 27 %
Lymphs Abs: 1.1 10*3/uL (ref 0.7–4.0)
MCH: 31.6 pg (ref 26.0–34.0)
MCHC: 34.2 g/dL (ref 30.0–36.0)
MCV: 92.3 fL (ref 80.0–100.0)
Monocytes Absolute: 0.4 10*3/uL (ref 0.1–1.0)
Monocytes Relative: 10 %
Neutro Abs: 2.5 10*3/uL (ref 1.7–7.7)
Neutrophils Relative %: 59 %
Platelets: 241 10*3/uL (ref 150–400)
RBC: 4.78 MIL/uL (ref 4.22–5.81)
RDW: 12.2 % (ref 11.5–15.5)
WBC: 4.2 10*3/uL (ref 4.0–10.5)
nRBC: 0 % (ref 0.0–0.2)

## 2019-08-09 LAB — CMP (CANCER CENTER ONLY)
ALT: 30 U/L (ref 0–44)
AST: 26 U/L (ref 15–41)
Albumin: 3.9 g/dL (ref 3.5–5.0)
Alkaline Phosphatase: 85 U/L (ref 38–126)
Anion gap: 6 (ref 5–15)
BUN: 18 mg/dL (ref 8–23)
CO2: 25 mmol/L (ref 22–32)
Calcium: 8.9 mg/dL (ref 8.9–10.3)
Chloride: 107 mmol/L (ref 98–111)
Creatinine: 1.17 mg/dL (ref 0.61–1.24)
GFR, Est AFR Am: >60 mL/min (ref >60)
GFR, Estimated: >60 mL/min (ref >60)
Glucose, Bld: 135 mg/dL — ABNORMAL HIGH (ref 70–99)
Potassium: 4.4 mmol/L (ref 3.5–5.1)
Sodium: 138 mmol/L (ref 135–145)
Total Bilirubin: 0.3 mg/dL (ref 0.3–1.2)
Total Protein: 6.9 g/dL (ref 6.5–8.1)

## 2019-08-09 LAB — TSH: TSH: 3.142 u[IU]/mL (ref 0.320–4.118)

## 2019-08-09 NOTE — Progress Notes (Signed)
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 male who is here for evaluation and management of Squamous Cell Carcinoma of the Head and Neck. The patient's last visit with [place] was on 02/09/2019. The pt reports that he is doing well overall.  The pt reports that he has been well and he denies any new concerns. He is not walking as much as before because he is getting more sore than he has in the past, which he attributes to aging. He has continued to eat and drink well. Pt has a f/u scheduled with Dr. Conley Canal sometime in the spring and Dr. Isidore Moos next month.   Lab results today (08/09/19) of CBC w/diff and CMP is as follows: all values are WNL except for Glucose at 135. 08/09/2019 TSH at 3.142  On review of systems, pt reports healthy appeitite and denies abdominal pain, bowel habit changes, chest pain, SOB, trouble swallowing, fevers, chills and any other symptoms.   MEDICAL HISTORY:  Past Medical History:  Diagnosis Date  . Diabetes mellitus ([place])   . Glaucoma   . History of radiation therapy 09/24/17- 11/05/17   head and neck, Left tonsil/ 60 gy delivered in 30 fractions of 2 Gy.   . Kidney stones 1985  . Neck mass 12/24/2016    SURGICAL HISTORY: Past Surgical History:  Procedure Laterality Date  . 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 FINE NEEDLE ASPIRATION  12/25/2016   Left neck, Level 2  . NASAL SEPTUM SURGERY     remotely  . TOE SURGERY     broken toe with bone ground  down.     SOCIAL HISTORY: Social History   Socioeconomic History  . Marital status: Married    Spouse name: [name]  . Number of children: 5  . Years of education: Not on file  . Highest education level: Not on file  Occupational History  . Not on file  Tobacco Use  . Smoking status: Former Smoker    Packs/day: 1.00    Years: 10.00    Pack years: 10.00    Types: Cigarettes    Quit date: 06/03/1985    Years since quitting: 34.2  . Smokeless tobacco: Former Systems developer    Types: [place] date: 06/03/1985  Substance and Sexual Activity  . Alcohol use: No  . Drug use: No  . Sexual activity: Not on file  Other Topics Concern  . Not on file  Social History Narrative  . Not on file   Social Determinants of Health   Financial Resource Strain:   . Difficulty of Paying Living Expenses: Not on file  Food Insecurity:   . Worried About Charity fundraiser in the Last Year: Not on file  . Ran Out of Food in the Last Year: Not on file  Transportation Needs:   . Lack of Transportation (Medical): Not on file  . Lack of Transportation (Non-Medical): Not on file  Physical Activity:   . Days of Exercise per Week: Not  on file  . Minutes of Exercise per Session: Not on file  Stress:   . Feeling of Stress : Not on file  Social Connections:   . Frequency of Communication with Friends and Family: Not on file  . Frequency of Social Gatherings with Friends and Family: Not on file  . Attends Religious Services: Not on file  . Active Member of Clubs or Organizations: Not on file  . Attends Archivist Meetings: Not on file  . Marital Status: Not on file  Intimate Partner Violence:   . Fear of Current or Ex-Partner: Not on file  . Emotionally Abused: Not on file  . Physically Abused: Not on file  . Sexually Abused: Not on file    FAMILY HISTORY: His mother had mucosal melanoma and father had prostate cancer. His sister had stage 4 pancreatic cancer.  His mother and father had DM.   His maternal grandfather had leukemia.  His maternal uncle had brain cancer.   ALLERGIES:  is allergic to bismuth subsalicylate.  MEDICATIONS:  Current Outpatient Medications  Medication Sig Dispense Refill  . atorvastatin (LIPITOR) 10 MG tablet TAKE 1 TABLET BY MOUTH ONCE A WEEK FOR HIGH CHOLESTEROL  3  . insulin aspart (NOVOLOG) 100 UNIT/ML injection Inject into the skin.    Marland Kitchen insulin NPH-regular Human (NOVOLIN 70/30) (70-30) 100 UNIT/ML injection 20 Units.     Marland Kitchen latanoprost (XALATAN) 0.005 % ophthalmic solution 1 drop at bedtime.     No current facility-administered medications for this visit.    REVIEW OF SYSTEMS:   A 10+ POINT REVIEW OF SYSTEMS WAS OBTAINED including neurology, dermatology, psychiatry, cardiac, respiratory, lymph, extremities, GI, GU, Musculoskeletal, constitutional, breasts, reproductive, HEENT.  All pertinent positives are noted in the HPI.  All others are negative.   PHYSICAL EXAMINATION: ECOG PERFORMANCE STATUS: 1 - Symptomatic but completely ambulatory  .BP 126/82 (BP Location: Left Arm, Patient Position: Sitting)   Pulse 63   Temp 98.3 F (36.8 C) (Oral)   Resp 17   Ht 5\' 9"  (1.753 m)   Wt 192 lb 4.8 oz (87.2 kg)   SpO2 95%   BMI 28.40 kg/m    GENERAL:alert, in no acute distress and comfortable SKIN: no acute rashes, no significant lesions EYES: conjunctiva are pink and non-injected, sclera anicteric OROPHARYNX: MMM, no exudates, no oropharyngeal erythema or ulceration NECK: supple, no JVD LYMPH:  no palpable lymphadenopathy in the cervical, axillary or inguinal regions LUNGS: clear to auscultation b/l with normal respiratory effort HEART: regular rate & rhythm ABDOMEN:  normoactive bowel sounds , non tender, not distended. No palpable hepatosplenomegaly.  Extremity: no pedal edema PSYCH: alert & oriented x 3 with fluent speech NEURO: no focal motor/sensory deficits  LABORATORY DATA:  I have reviewed the data as listed  . CBC Latest Ref  Rng & Units 08/09/2019 02/09/2019 08/07/2018  WBC 4.0 - 10.5 K/uL 4.2 4.6 6.1  Hemoglobin 13.0 - 17.0 g/dL 15.1 14.4 14.4  Hematocrit 39.0 - 52.0 % 44.1 41.8 42.2  Platelets 150 - 400 K/uL 241 233 234    . CMP Latest Ref Rng & Units 08/09/2019 02/09/2019 08/07/2018  Glucose 70 - 99 mg/dL 135(H) 155(H) 67(L)  BUN 8 - 23 mg/dL 18 18 19   Creatinine 0.61 - 1.24 mg/dL 1.17 1.19 1.20  Sodium 135 - 145 mmol/L 138 139 138  Potassium 3.5 - 5.1 mmol/L 4.4 4.0 3.9  Chloride 98 - 111 mmol/L 107 107 106  CO2 22 - 32 mmol/L 25 23  24  Calcium 8.9 - 10.3 mg/dL 8.9 9.2 9.1  Total Protein 6.5 - 8.1 g/dL 6.9 7.0 7.2  Total Bilirubin 0.3 - 1.2 mg/dL 0.3 <0.2(L) 0.7  Alkaline Phos 38 - 126 U/L 85 76 70  AST 15 - 41 U/L 26 25 42(H)  ALT 0 - 44 U/L 30 26 39   TSH 3.142  PATHOLOGY   Diagnosis 12/25/16   CONSULT SLIDE, LEFT NECK LEVEL 2 MALIGNANT CELLS PRESENT, CONSISTENT WITH SQUAMOUS CELL CARCINOMA. 04/16/2017  03/17/2017  02/20/2017   Surgical Pathology Tissue ExamResulted: 08/19/2017 4:44 PM [facility] Specimen Collected on  Lymph Node 08/15/2017 9:02 PM  Result Narrative    ACCESSION [id number] RECEIVED: 08/15/2017 ORDERING PHYSICIAN:CHRISTOPHER A SULLIVAN , MD PATIENT NAME:Isaza, Sourish [facility] REPORT  FINAL PATHOLOGIC DIAGNOSIS MICROSCOPIC EXAMINATION AND DIAGNOSIS   "LEFT LEVEL 3&5 LYMPH NODE", EXCISON: Metastatic squamous cell carcinoma involving one of eight lymph nodes (1/8).    I have personally reviewed the slides and/or other related materials referenced, and have edited the report as part of my pathologic assessment and final interpretation.  Electronically Signed Out By: Sherlon Handing , MD 08/19/2017 16:44:05    RADIOGRAPHIC STUDIES: I have personally reviewed the radiological images as listed and agreed with the findings in the report.  PET/CT 07/16/2017 PET HEAD AND NECK CANCER (W/LOW DOSE  CT)07/16/2017 [facility] Result Impression   1.Hypermetabolic left level III cervical lymph node consistent with metastatic disease.  2.Additional nonhypermetabolic subcentimeter left level V cervical lymph nodes are also noted. They are of questionable significance  3.Focal area of increased uptake in the posterior left liver, of questionable significance. Recommend MRI of the liver for further evaluation. 4.Apparent bladder wall thickening which may in part be due to underdistention. Consider urinalysis and urology consult if clinically indicated. 5.Ancillary CT findings as above.    PET 01/14/17 IMPRESSION: 1. Focal area of FDG uptake in the left lateral pharynx/upper left tonsillar region likely representing the primary neoplasm. 2. Metastatic hypermetabolic left level 2 neck nodes. No other FDG positive nodal stations or contralateral adenopathy. 3. No findings for metastatic disease.   CT Soft Tissue W Contrast 01/03/17 IMPRESSION: 1. Abnormally enlarged, solid left level 2 lymph node up to 4.3 cm corresponds to the palpable area of concern. Other left level 2 and level 3 nodes are normal by size criteria but asymmetrically increased. No contralateral lymphadenopathy. No primary tumor identified. 2. Burtis Junes this is metastatic nodal disease from an occult pharyngeal/laryngeal primary. Less likely differential considerations are lymphoproliferative disorder, lymphoma, or metastatic disease from a regional skin cancer or a distant primary. 3. Tiny right apical lung nodules appear postinflammatory.   ASSESSMENT & PLAN:   IANCARLO NEUBURGER is a 69 y.o. 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 Only 10pk-yr h/o smoking and quit in 1982 patient is s/p TORS surgery to confirm that the left tonsils/BOT area is  the primary lesion as suggested by PET/CT . -he has subsequently had radical lymph node dissection of his neck which shows 1 out of 21 lymph nodes with metastatic disease 5.1 cm without ENE. --patient had rpt PET/CT on 07/16/2017 which showed  hypermetabolic left level III cervical lymph node consistent with metastatic disease. Additional nonhypermetabolic subcentimeter left level V cervical lymph nodes are also noted. They are of questionable  significance. Focal area of increased uptake in the posterior left liver, of questionable significance - on 08/15/2017 by Dr Conley Canal yielded metastatic squamous cell carcinoma in 1 out of 8 lymph nodes. -Completed adjuvant IMRT with Dr Isidore Moos  02/05/18 PET/CT which revealed  Resolution hypermetabolic LEFT level 2 lymph node. No 2 no hypermetabolic lymph nodes in the neck. 2. No residual hypermetabolic activity in the hypopharynx. 3. Single new RIGHT upper lobe pulmonary nodule with mild metabolic activity is indeterminate. Recommend short-term CT follow-up to demonstrate stability/resolution or growth."  05/13/2018 CT Chest ([id number]) revealed "Radiation changes at the anterior lung apices. No suspicious pulmonary nodules."  PLAN: -Discussed pt labwork today, 08/09/19; all values are WNL except for Glucose at 135. -Discussed 08/09/2019 TSH is WNL -The pt shows no clinical or lab progression/return of his squamous cell carcinoma at this time. -No indication for further treatment at this time.   -Recommend pt receive the COVID19 when available  -Continue every 3 month local evaluations with ENT Dr. Conley Canal  -Will begin annual follow ups due to continued disease stability  -Advised pt to contact us or ENT physicians if any symptoms arise -Will see the pt back in 12 months with labs  #3 Diabetes Plan -recommended drinking atleast 60OZ of water daily -close f/u with PCP to optimize DM2 management. -Recommend that PCP consider basal insulin like Lantus as  opposed to NPH   FOLLOW UP: RTC with Dr Irene Limbo with labs in 12 months   The total time spent in the appt was 20 minutes and more than 50% was on counseling and direct patient cares.  All of the patient's questions were answered with apparent satisfaction. The patient knows to call the clinic with any problems, questions or concerns.   Sullivan Lone MD [place] AAHIVMS [facility] Hematology/Oncology Physician [facility]  (Office):       [phone] (Work cell):  [phone] (Fax):           [phone]  08/09/2019 4:38 PM  I, Yevette Edwards, am acting as a scribe for Dr. Sullivan Lone.   .I have reviewed the above documentation for accuracy and completeness, and I agree with the above. Brunetta Genera MD

## 2019-08-12 ENCOUNTER — Telehealth: Payer: Self-pay | Admitting: Hematology

## 2019-08-12 NOTE — Telephone Encounter (Signed)
Scheduled per 03/08, spoke with patient's wife and she will notify the patient.

## 2019-08-16 DIAGNOSIS — C4442 Squamous cell carcinoma of skin of scalp and neck: Secondary | ICD-10-CM | POA: Diagnosis not present

## 2019-08-16 DIAGNOSIS — C099 Malignant neoplasm of tonsil, unspecified: Secondary | ICD-10-CM | POA: Diagnosis not present

## 2019-08-16 DIAGNOSIS — Z87891 Personal history of nicotine dependence: Secondary | ICD-10-CM | POA: Diagnosis not present

## 2019-08-16 DIAGNOSIS — R05 Cough: Secondary | ICD-10-CM | POA: Diagnosis not present

## 2019-09-10 NOTE — Progress Notes (Signed)
Mr. Tracy [name] presents today for follow up of radiation completed on 11/06/2018 to his left tonsil bed and bilateral neck  Pain issues, if [id number] Using a feeding tube?: N/A Weight changes, if any:  Filed Weights   09/17/19 1017  Weight: 193 lb 6 oz (87.7 kg)    @last3wt @ Swallowing issues, if any none Smoking or chewing tobacco? none Using fluoride trays daily? No using floride toothpaste from his dentist Last ENT visit was on: 08/16/2019 saw Dr. [name]: "My impression is that Tracy [name] has no evidence of tonsil cancer" Plan:  --NED --F/U x 6 mos Other notable issues, if any: doiing well denies any issues or complaints of.

## 2019-09-17 ENCOUNTER — Other Ambulatory Visit: Payer: Self-pay

## 2019-09-17 ENCOUNTER — Encounter: Payer: Self-pay | Admitting: Radiation Oncology

## 2019-09-17 ENCOUNTER — Ambulatory Visit
Admission: RE | Admit: 2019-09-17 | Discharge: 2019-09-17 | Disposition: A | Discharge status: Home / Self Care | Payer: Medicare Other | Source: Ambulatory Visit | Attending: Radiation Oncology | Admitting: Radiation Oncology

## 2019-09-17 VITALS — BP 127/86 | HR 68 | Temp 99.1°F | Resp 18 | Ht 69.0 in | Wt 193.4 lb

## 2019-09-17 DIAGNOSIS — Z79899 Other long term (current) drug therapy: Secondary | ICD-10-CM | POA: Diagnosis not present

## 2019-09-17 DIAGNOSIS — Z923 Personal history of irradiation: Secondary | ICD-10-CM | POA: Diagnosis not present

## 2019-09-17 DIAGNOSIS — Z23 Encounter for immunization: Secondary | ICD-10-CM | POA: Diagnosis not present

## 2019-09-17 DIAGNOSIS — Z85819 Personal history of malignant neoplasm of unspecified site of lip, oral cavity, and pharynx: Secondary | ICD-10-CM | POA: Diagnosis not present

## 2019-09-17 DIAGNOSIS — Z794 Long term (current) use of insulin: Secondary | ICD-10-CM | POA: Diagnosis not present

## 2019-09-17 DIAGNOSIS — C090 Malignant neoplasm of tonsillar fossa: Secondary | ICD-10-CM

## 2019-09-17 DIAGNOSIS — Z85818 Personal history of malignant neoplasm of other sites of lip, oral cavity, and pharynx: Secondary | ICD-10-CM | POA: Diagnosis not present

## 2019-09-17 DIAGNOSIS — Z85828 Personal history of other malignant neoplasm of skin: Secondary | ICD-10-CM | POA: Diagnosis not present

## 2019-09-17 DIAGNOSIS — C4442 Squamous cell carcinoma of skin of scalp and neck: Secondary | ICD-10-CM

## 2019-09-17 DIAGNOSIS — Z08 Encounter for follow-up examination after completed treatment for malignant neoplasm: Secondary | ICD-10-CM | POA: Diagnosis not present

## 2019-09-17 NOTE — Progress Notes (Signed)
Radiation Oncology         [phone] ________________________________  Name: Tracy [name] MRN: [id number]  Date: 09/17/2019  DOB: Apr 03, 1951  Follow-Up Visit Note in person, outpatient  CC: Tracy Bis, MD  Tracy [name], *  Diagnosis and Prior Radiotherapy:    C09.0    ICD-10-CM   1. Squamous cell carcinoma of neck  C44.42 Amb Referral to Survivorship Long term  2. Cancer of tonsillar fossa ([place])  C09.0     CHIEF COMPLAINT:  Here for follow-up and surveillance of his tonsillar fossa cancer  Narrative:  The patient returns today for routine follow-up. Doing well.  He denies any swallowing issues.  He is using enhanced fluoride toothpaste.  He just saw his dentist.  He saw his otolaryngologist a month ago with no evidence of disease.  He is following closely with medical oncology.  He is working frequently and staying busy.  He has no new complaints.  He is eating well and has gained a little weight.  Wt Readings from Last 3 Encounters:  09/17/19 193 lb 6 oz (87.7 kg)  08/09/19 192 lb 4.8 oz (87.2 kg)  02/09/19 187 lb (84.8 kg)       ALLERGIES:  is allergic to bismuth subsalicylate.  Meds: Current Outpatient Medications  Medication Sig Dispense Refill  . atorvastatin (LIPITOR) 10 MG tablet TAKE 1 TABLET BY MOUTH ONCE A WEEK FOR HIGH CHOLESTEROL  3  . insulin aspart (NOVOLOG) 100 UNIT/ML injection Inject into the skin.    Marland Kitchen insulin NPH-regular Human (NOVOLIN 70/30) (70-30) 100 UNIT/ML injection 20 Units.     Marland Kitchen latanoprost (XALATAN) 0.005 % ophthalmic solution 1 drop at bedtime.     No current facility-administered medications for this encounter.    Physical Findings: The patient is in no acute distress. Patient is alert and oriented. Wt Readings from Last 3 Encounters:  09/17/19 193 lb 6 oz (87.7 kg)  08/09/19 192 lb 4.8 oz (87.2 kg)  02/09/19 187 lb (84.8 kg)    height is 5\' 9"  (1.753 m) and weight is 193 lb 6 oz (87.7 kg). His temporal temperature  is 99.1 F (37.3 C). His blood pressure is 127/86 and his pulse is 68. His respiration is 18 and oxygen saturation is 98%. .  General: Alert and oriented, in no acute distress HEENT: Head is normocephalic. Extraocular movements are intact. Oropharynx/ mouth notable for no sign of tumor  Neck: Neck is notable for mild fibrosis and edema, no masses Skin: Skin in treatment fields shows satisfactory healing  Heart regular in rate and rhythm no murmurs Chest clear to auscultation bilaterally Lymphatics: see Neck Exam Psychiatric: Judgment and insight are intact. Affect is appropriate.   Lab Findings: Lab Results  Component Value Date   WBC 4.2 08/09/2019   HGB 15.1 08/09/2019   HCT 44.1 08/09/2019   MCV 92.3 08/09/2019   PLT 241 08/09/2019    Lab Results  Component Value Date   TSH 3.142 08/09/2019    Radiographic Findings: No results found.  Impression/Plan:    1) Head and Neck Cancer Status: NED  2) Nutritional Status: good PEG tube: none  3) Risk Factors: The patient has been educated about risk factors including alcohol and tobacco abuse; they understand that avoidance of alcohol and tobacco is important to prevent recurrences as well as other cancers  4) Swallowing: good  5) Dental: Encouraged to continue regular followup with dentistry, and dental hygiene including fluoride rinses. He will use Prevident  5000.  6) Thyroid function:  Lab Results  Component Value Date   TSH 3.142 08/09/2019  WNL, contiue to check annually in med/onc   7) Other: I talked to him about our survivorship clinic.  He is interested in this.  Due to other commitments he would like to hold off on this until 9 months from now.  Referral made.  Continue f/u w/ ENT.  He will see me back on a as needed basis and continue alternating follow-ups with medical oncology and otolaryngology.  He knows to call me anytime if he like an additional appointment in radiation oncology.  We discussed  measures to reduce the risk of infection during the COVID-19 pandemic.  He is getting his first vaccine today.  On date of service in total I spent 20 minutes on this encounter.  Patient was seen in person.    ___________   [name], MD

## 2019-09-17 NOTE — Patient Instructions (Signed)
Coronavirus (COVID-19) Are you at risk?  Are you at risk for the Coronavirus (COVID-19)?  To be considered HIGH RISK for Coronavirus (COVID-19), you have to meet the following criteria:  . Traveled to [place], [place], [place], [place] or [place]; or in the [place] to [place], [place], [place], or [place]; and have fever, cough, and shortness of breath within the last 2 weeks of travel OR . Been in close contact with a person diagnosed with COVID-19 within the last 2 weeks and have fever, cough, and shortness of breath . IF YOU DO NOT MEET THESE CRITERIA, YOU ARE CONSIDERED LOW RISK FOR COVID-19.  What to do if you are HIGH RISK for COVID-19?  . If you are having a medical emergency, call 911. . Seek medical care right away. Before you go to a doctor's office, urgent care or emergency department, call ahead and tell them about your recent travel, contact with someone diagnosed with COVID-19, and your symptoms. You should receive instructions from your physician's office regarding next steps of care.  . When you arrive at healthcare provider, tell the healthcare staff immediately you have returned from visiting [place], [place], [place], [place] or [place]; or traveled in the [place] to [place], [place], [place], or [place]; in the last two weeks or you have been in close contact with a person diagnosed with COVID-19 in the last 2 weeks.   . Tell the health care staff about your symptoms: fever, cough and shortness of breath. . After you have been seen by a medical provider, you will be either: o Tested for (COVID-19) and discharged home on quarantine except to seek medical care if symptoms worsen, and asked to  - Stay home and avoid contact with others until you get your results (4-5 days)  - Avoid travel on public transportation if possible (such as bus, train, or airplane) or o Sent to the Emergency Department by EMS for evaluation, COVID-19 testing, and possible  admission depending on your condition and test results.  What to do if you are LOW RISK for COVID-19?  Reduce your risk of any infection by using the same precautions used for avoiding the common cold or flu:  . Wash your hands often with soap and warm water for at least 20 seconds.  If soap and water are not readily available, use an alcohol-based hand sanitizer with at least 60% alcohol.  . If coughing or sneezing, cover your mouth and nose by coughing or sneezing into the elbow areas of your shirt or coat, into a tissue or into your sleeve (not your hands). . Avoid shaking hands with others and consider head nods or verbal greetings only. . Avoid touching your eyes, nose, or mouth with unwashed hands.  . Avoid close contact with people who are sick. . Avoid places or events with large numbers of people in one location, like concerts or sporting events. . Carefully consider travel plans you have or are making. . If you are planning any travel outside or inside the [place], visit the CDC's Travelers' Health webpage for the latest health notices. . If you have some symptoms but not all symptoms, continue to monitor at home and seek medical attention if your symptoms worsen. . If you are having a medical emergency, call 911.   ADDITIONAL HEALTHCARE OPTIONS FOR PATIENTS  [facility] Tele[facility] / e-Visit: [email or web address]         [facility]: [phone]  [place]: [phone]                   [facility]: [phone]   

## 2019-09-20 DIAGNOSIS — E119 Type 2 diabetes mellitus without complications: Secondary | ICD-10-CM | POA: Diagnosis not present

## 2019-10-13 DIAGNOSIS — E782 Mixed hyperlipidemia: Secondary | ICD-10-CM | POA: Diagnosis not present

## 2019-10-13 DIAGNOSIS — Z9189 Other specified personal risk factors, not elsewhere classified: Secondary | ICD-10-CM | POA: Diagnosis not present

## 2019-10-13 DIAGNOSIS — E1165 Type 2 diabetes mellitus with hyperglycemia: Secondary | ICD-10-CM | POA: Diagnosis not present

## 2019-10-13 DIAGNOSIS — I10 Essential (primary) hypertension: Secondary | ICD-10-CM | POA: Diagnosis not present

## 2019-10-13 DIAGNOSIS — N400 Enlarged prostate without lower urinary tract symptoms: Secondary | ICD-10-CM | POA: Diagnosis not present

## 2019-10-13 DIAGNOSIS — C091 Malignant neoplasm of tonsillar pillar (anterior) (posterior): Secondary | ICD-10-CM | POA: Diagnosis not present

## 2019-10-13 DIAGNOSIS — E11649 Type 2 diabetes mellitus with hypoglycemia without coma: Secondary | ICD-10-CM | POA: Diagnosis not present

## 2019-10-13 DIAGNOSIS — R5382 Chronic fatigue, unspecified: Secondary | ICD-10-CM | POA: Diagnosis not present

## 2019-10-15 DIAGNOSIS — Z23 Encounter for immunization: Secondary | ICD-10-CM | POA: Diagnosis not present

## 2019-10-19 ENCOUNTER — Telehealth: Payer: Self-pay | Admitting: Medical

## 2019-10-19 NOTE — Telephone Encounter (Signed)
Scheduled appt per 4/20 sch message - mailed reminder letter with appt date and time

## 2019-10-20 DIAGNOSIS — Z6828 Body mass index (BMI) 28.0-28.9, adult: Secondary | ICD-10-CM | POA: Diagnosis not present

## 2019-10-20 DIAGNOSIS — E782 Mixed hyperlipidemia: Secondary | ICD-10-CM | POA: Diagnosis not present

## 2019-10-20 DIAGNOSIS — Z0001 Encounter for general adult medical examination with abnormal findings: Secondary | ICD-10-CM | POA: Diagnosis not present

## 2019-10-20 DIAGNOSIS — I10 Essential (primary) hypertension: Secondary | ICD-10-CM | POA: Diagnosis not present

## 2019-10-20 DIAGNOSIS — E1165 Type 2 diabetes mellitus with hyperglycemia: Secondary | ICD-10-CM | POA: Diagnosis not present

## 2019-11-12 DIAGNOSIS — Z6827 Body mass index (BMI) 27.0-27.9, adult: Secondary | ICD-10-CM | POA: Diagnosis not present

## 2019-11-12 DIAGNOSIS — R5383 Other fatigue: Secondary | ICD-10-CM | POA: Diagnosis not present

## 2019-11-17 DIAGNOSIS — E1165 Type 2 diabetes mellitus with hyperglycemia: Secondary | ICD-10-CM | POA: Diagnosis not present

## 2019-11-17 DIAGNOSIS — Z6827 Body mass index (BMI) 27.0-27.9, adult: Secondary | ICD-10-CM | POA: Diagnosis not present

## 2019-11-17 DIAGNOSIS — R300 Dysuria: Secondary | ICD-10-CM | POA: Diagnosis not present

## 2019-11-17 DIAGNOSIS — R05 Cough: Secondary | ICD-10-CM | POA: Diagnosis not present

## 2019-12-03 DIAGNOSIS — G4733 Obstructive sleep apnea (adult) (pediatric): Secondary | ICD-10-CM | POA: Diagnosis not present

## 2019-12-03 DIAGNOSIS — R5383 Other fatigue: Secondary | ICD-10-CM | POA: Diagnosis not present

## 2020-02-09 ENCOUNTER — Other Ambulatory Visit: Payer: Self-pay | Admitting: Adult Health

## 2020-02-18 DIAGNOSIS — Z87891 Personal history of nicotine dependence: Secondary | ICD-10-CM | POA: Diagnosis not present

## 2020-02-18 DIAGNOSIS — Z923 Personal history of irradiation: Secondary | ICD-10-CM | POA: Diagnosis not present

## 2020-02-18 DIAGNOSIS — C099 Malignant neoplasm of tonsil, unspecified: Secondary | ICD-10-CM | POA: Diagnosis not present

## 2020-02-23 DIAGNOSIS — E782 Mixed hyperlipidemia: Secondary | ICD-10-CM | POA: Diagnosis not present

## 2020-02-23 DIAGNOSIS — E11649 Type 2 diabetes mellitus with hypoglycemia without coma: Secondary | ICD-10-CM | POA: Diagnosis not present

## 2020-02-23 DIAGNOSIS — Z9189 Other specified personal risk factors, not elsewhere classified: Secondary | ICD-10-CM | POA: Diagnosis not present

## 2020-02-23 DIAGNOSIS — I10 Essential (primary) hypertension: Secondary | ICD-10-CM | POA: Diagnosis not present

## 2020-03-01 DIAGNOSIS — E782 Mixed hyperlipidemia: Secondary | ICD-10-CM | POA: Diagnosis not present

## 2020-03-01 DIAGNOSIS — I10 Essential (primary) hypertension: Secondary | ICD-10-CM | POA: Diagnosis not present

## 2020-03-01 DIAGNOSIS — Z23 Encounter for immunization: Secondary | ICD-10-CM | POA: Diagnosis not present

## 2020-03-01 DIAGNOSIS — E1165 Type 2 diabetes mellitus with hyperglycemia: Secondary | ICD-10-CM | POA: Diagnosis not present

## 2020-03-01 DIAGNOSIS — Z6827 Body mass index (BMI) 27.0-27.9, adult: Secondary | ICD-10-CM | POA: Diagnosis not present

## 2020-03-08 DIAGNOSIS — H401111 Primary open-angle glaucoma, right eye, mild stage: Secondary | ICD-10-CM | POA: Diagnosis not present

## 2020-06-13 ENCOUNTER — Encounter: Payer: Self-pay | Admitting: Physician Assistant

## 2020-06-13 ENCOUNTER — Ambulatory Visit (INDEPENDENT_AMBULATORY_CARE_PROVIDER_SITE_OTHER): Payer: Medicare Other | Admitting: Physician Assistant

## 2020-06-13 ENCOUNTER — Ambulatory Visit: Payer: Self-pay

## 2020-06-13 DIAGNOSIS — M722 Plantar fascial fibromatosis: Secondary | ICD-10-CM | POA: Diagnosis not present

## 2020-06-13 NOTE — Progress Notes (Signed)
Office Visit Note   Patient: Tracy [name]           Date of Birth: [birth year]           MRN: [id number] Visit Date: 06/13/2020              Requested by: [name], MD [place],   [id number] PCP: [name], MD  No chief complaint on file.     HPI: Patient is a pleasant 71 year old gentleman with a 58-month history of left heel pain.  He denies any injuries but does say he is stands on concrete for long periods of time.  He recalls a remote history of having similar pain in the past and he purchased arch supports which made the pain go away.  He thinks he even had it a little bit in his right foot.  He is trying to be more active to control his weight as he is a diabetic.  He does say the pain is often worse first thing in the morning  Assessment & Plan: Visit Diagnoses:  1. Plantar fasciitis of left foot     Plan: We discussed the importance of Achilles and plantar fascia stretching which I demonstrated to him today.  Would like for him to try and do this 2 or 3 times a day.  Also discussed with him sole arch supports and he will obtain some type of arch support in the near future.  He could try a night splint considering some of his pain is worse first thing in the morning but he would like to refrain from this for now.  We will follow-up in 4 weeks could consider an injection at that time he did not want 1 today  Follow-Up Instructions: No follow-ups on file.   Ortho Exam  Patient is alert, oriented, no adenopathy, well-dressed, normal affect, normal respiratory effort. Left foot palpable dorsalis pedis pulse.  No swelling no cellulitis no erythema.  No tenderness with manipulation of the midfoot.  He does have tightness in his Achilles and comes to just past neutral.  He is focally tender in the plantar origin of the plantar fascia on the heel  Imaging: No results found. No images are attached to the encounter.  Labs: No results found for: HGBA1C,  ESRSEDRATE, CRP, LABURIC, REPTSTATUS, GRAMSTAIN, CULT, LABORGA   Lab Results  Component Value Date   ALBUMIN 3.9 08/09/2019   ALBUMIN 4.0 02/09/2019   ALBUMIN 4.2 08/07/2018    Lab Results  Component Value Date   MG 2.1 08/07/2018   MG 2.2 04/21/2017   No results found for: VD25OH  No results found for: PREALBUMIN CBC EXTENDED Latest Ref Rng & Units 08/09/2019 02/09/2019 08/07/2018  WBC 4.0 - 10.5 K/uL 4.2 4.6 6.1  RBC 4.22 - 5.81 MIL/uL 4.78 4.56 4.49  HGB 13.0 - 17.0 g/dL 15.1 14.4 14.4  HCT 39.0 - 52.0 % 44.1 41.8 42.2  PLT 150 - 400 K/uL 241 233 234  NEUTROABS 1.7 - 7.7 K/uL 2.5 3.0 4.0  LYMPHSABS 0.7 - 4.0 K/uL 1.1 1.0 1.4     There is no height or weight on file to calculate BMI.  Orders:  Orders Placed This Encounter  Procedures  . XR Foot 2 Views Left   No orders of the defined types were placed in this encounter.    Procedures: No procedures performed  Clinical Data: No additional findings.  ROS:  All other systems negative, except as noted  in the HPI. Review of Systems  Objective: Vital Signs: There were no vitals taken for this visit.  Specialty Comments:  No specialty comments available.  PMFS History: Patient Active Problem List   Diagnosis Date Noted  . Squamous cell carcinoma of neck 08/16/2017  . DM (diabetes mellitus), type 2 ([place]) 03/12/2017  . Metastatic squamous cell carcinoma involving lymph node with unknown primary site ([place]) 02/11/2017  . Cancer of tonsillar fossa ([place]) 01/29/2017  . Neck mass 12/24/2016  . Presbycusis of both ears 12/24/2016   Past Medical History:  Diagnosis Date  . Diabetes mellitus ([place])   . Glaucoma   . History of radiation therapy 09/24/17- 11/05/17   head and neck, Left tonsil/ 60 gy delivered in 30 fractions of 2 Gy.   . Kidney stones 1985  . Neck mass 12/24/2016    No family history on file.  Past Surgical History:  Procedure Laterality Date  . 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 FINE  NEEDLE ASPIRATION  12/25/2016   Left neck, Level 2  . NASAL SEPTUM SURGERY     remotely  . TOE SURGERY     broken toe with bone ground down.    Social History   Occupational History  . Not on file  Tobacco Use  . Smoking status: Former Smoker    Packs/day: 1.00    Years: 10.00    Pack years: 10.00    Types: Cigarettes    Quit date: 06/03/1985    Years since quitting: 35.0  . Smokeless tobacco: Former Systems developer    Types: [place] date: 06/03/1985  Vaping Use  . Vaping Use: Never used  Substance and Sexual Activity  . Alcohol use: No  . Drug use: No  . Sexual activity: Not on file

## 2020-06-20 DIAGNOSIS — E782 Mixed hyperlipidemia: Secondary | ICD-10-CM | POA: Diagnosis not present

## 2020-06-20 DIAGNOSIS — E1165 Type 2 diabetes mellitus with hyperglycemia: Secondary | ICD-10-CM | POA: Diagnosis not present

## 2020-06-20 DIAGNOSIS — I10 Essential (primary) hypertension: Secondary | ICD-10-CM | POA: Diagnosis not present

## 2020-06-20 DIAGNOSIS — Z1329 Encounter for screening for other suspected endocrine disorder: Secondary | ICD-10-CM | POA: Diagnosis not present

## 2020-06-20 DIAGNOSIS — Z1159 Encounter for screening for other viral diseases: Secondary | ICD-10-CM | POA: Diagnosis not present

## 2020-06-20 DIAGNOSIS — R5382 Chronic fatigue, unspecified: Secondary | ICD-10-CM | POA: Diagnosis not present

## 2020-06-22 ENCOUNTER — Inpatient Hospital Stay: Payer: Medicare Other | Admitting: Medical

## 2020-06-27 DIAGNOSIS — Z7189 Other specified counseling: Secondary | ICD-10-CM | POA: Diagnosis not present

## 2020-06-27 DIAGNOSIS — Z6828 Body mass index (BMI) 28.0-28.9, adult: Secondary | ICD-10-CM | POA: Diagnosis not present

## 2020-06-27 DIAGNOSIS — I10 Essential (primary) hypertension: Secondary | ICD-10-CM | POA: Diagnosis not present

## 2020-06-27 DIAGNOSIS — E1165 Type 2 diabetes mellitus with hyperglycemia: Secondary | ICD-10-CM | POA: Diagnosis not present

## 2020-06-27 DIAGNOSIS — E7849 Other hyperlipidemia: Secondary | ICD-10-CM | POA: Diagnosis not present

## 2020-07-04 DIAGNOSIS — Z23 Encounter for immunization: Secondary | ICD-10-CM | POA: Diagnosis not present

## 2020-07-11 ENCOUNTER — Inpatient Hospital Stay: Payer: Medicare Other | Admitting: Medical

## 2020-07-28 ENCOUNTER — Other Ambulatory Visit: Payer: Self-pay

## 2020-07-28 ENCOUNTER — Inpatient Hospital Stay: Payer: Medicare Other | Attending: Medical | Admitting: Medical

## 2020-07-28 VITALS — HR 74 | Temp 98.0°F | Resp 18 | Ht 69.0 in | Wt 199.6 lb

## 2020-07-28 DIAGNOSIS — E119 Type 2 diabetes mellitus without complications: Secondary | ICD-10-CM | POA: Insufficient documentation

## 2020-07-28 DIAGNOSIS — C770 Secondary and unspecified malignant neoplasm of lymph nodes of head, face and neck: Secondary | ICD-10-CM | POA: Diagnosis not present

## 2020-07-28 DIAGNOSIS — Z806 Family history of leukemia: Secondary | ICD-10-CM | POA: Insufficient documentation

## 2020-07-28 DIAGNOSIS — C760 Malignant neoplasm of head, face and neck: Secondary | ICD-10-CM | POA: Diagnosis not present

## 2020-07-28 DIAGNOSIS — Z923 Personal history of irradiation: Secondary | ICD-10-CM | POA: Diagnosis not present

## 2020-07-28 DIAGNOSIS — C090 Malignant neoplasm of tonsillar fossa: Secondary | ICD-10-CM

## 2020-07-28 DIAGNOSIS — Z8042 Family history of malignant neoplasm of prostate: Secondary | ICD-10-CM | POA: Diagnosis not present

## 2020-07-28 DIAGNOSIS — Z800 Family history of malignant neoplasm of digestive organs: Secondary | ICD-10-CM | POA: Diagnosis not present

## 2020-07-28 DIAGNOSIS — Z87891 Personal history of nicotine dependence: Secondary | ICD-10-CM | POA: Diagnosis not present

## 2020-07-28 DIAGNOSIS — Z794 Long term (current) use of insulin: Secondary | ICD-10-CM | POA: Diagnosis not present

## 2020-07-28 DIAGNOSIS — Z808 Family history of malignant neoplasm of other organs or systems: Secondary | ICD-10-CM | POA: Insufficient documentation

## 2020-07-28 DIAGNOSIS — B977 Papillomavirus as the cause of diseases classified elsewhere: Secondary | ICD-10-CM | POA: Diagnosis not present

## 2020-08-01 ENCOUNTER — Telehealth: Payer: Self-pay | Admitting: Hematology

## 2020-08-01 NOTE — Telephone Encounter (Signed)
Called patient regarding 03/08 appointments, patient has been called and notified.

## 2020-08-07 NOTE — Progress Notes (Signed)
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 male who is here for evaluation and management of Squamous Cell Carcinoma of the Head and Neck. The patient's last visit with [place] was on 08/09/2019. The pt reports that he is doing well overall.  The pt reports no new concerns or symptoms. He has been gaining some weight and his saliva glands and taste has been coming back. The pt notes no major changes in medications. The pt last saw Dr. Conley Canal last month, with no issues noted. The pt notes he is working full time still. The pt notes he has received his COVID booster and vaccine. The pt notes he does have Glaucoma, but uses eye drops for this and is well controlled.   Lab results today 08/08/2020 of CBC w/diff and CMP is as follows: all values are WNL except for Glucose of 61, Creatinine of 1.25.  08/08/2020 TSH is 3.55  On review of systems, pt denies difficulty swallowing, decreased appetite, unexpected weight loss, back pain, abdominal pain, leg swelling, dental issues, and any other symptoms.   MEDICAL HISTORY:  Past Medical History:  Diagnosis Date  . Diabetes mellitus ([place])   . Glaucoma   . History of radiation therapy 09/24/17- 11/05/17   head and neck, Left tonsil/ 60 gy delivered in 30 fractions of 2 Gy.   . Kidney stones 1985  . Neck mass 12/24/2016    SURGICAL HISTORY: Past Surgical History:  Procedure Laterality Date  . 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 FINE  NEEDLE ASPIRATION  12/25/2016   Left neck, Level 2  . NASAL SEPTUM SURGERY     remotely  . TOE SURGERY     broken toe with bone ground down.     SOCIAL HISTORY: Social History   Socioeconomic History  . Marital status: Married    Spouse name: [name]  . Number of children: 5  . Years of education: Not on file  . Highest education level: Not on file  Occupational History  . Not on file  Tobacco Use  . Smoking status: Former Smoker    Packs/day: 1.00    Years: 10.00    Pack years: 10.00    Types: Cigarettes    Quit date: 06/03/1985    Years since quitting: 35.2  . Smokeless tobacco: Former Systems developer    Types: [place] date: 06/03/1985  Vaping Use  . Vaping Use: Never used  Substance and Sexual Activity  . Alcohol use: No  . Drug use: No  . Sexual activity: Not on file  Other Topics Concern  . Not on file  Social History Narrative  . Not on file   Social Determinants of Health   Financial Resource Strain: Not on file  Food Insecurity: Not on file  Transportation Needs: Not on file  Physical Activity: Not on file  Stress: Not on file  Social Connections: Not on file  Intimate Partner Violence: Not on file    FAMILY HISTORY: His mother had  mucosal melanoma and father had prostate cancer. His sister had stage 4 pancreatic cancer.  His mother and father had DM.  His maternal grandfather had leukemia.  His maternal uncle had brain cancer.   ALLERGIES:  is allergic to bismuth subsalicylate.  MEDICATIONS:  Current Outpatient Medications  Medication Sig Dispense Refill  . atorvastatin (LIPITOR) 10 MG tablet TAKE 1 TABLET BY MOUTH ONCE A WEEK FOR HIGH CHOLESTEROL  3  . insulin aspart (NOVOLOG) 100 UNIT/ML injection Inject into the skin.    Marland Kitchen insulin NPH-regular Human (NOVOLIN 70/30) (70-30) 100 UNIT/ML injection 20 Units.     Marland Kitchen latanoprost (XALATAN) 0.005 % ophthalmic solution 1 drop at bedtime.     No current facility-administered medications for this visit.     REVIEW OF SYSTEMS:   10 Point review of Systems was done is negative except as noted above.  PHYSICAL EXAMINATION: ECOG PERFORMANCE STATUS: 1 - Symptomatic but completely ambulatory  .BP 117/68 (BP Location: Left Arm, Patient Position: Sitting)   Pulse 64   Temp (!) 97.5 F (36.4 C) (Tympanic)   Resp 17   Ht 5\' 9"  (1.753 m)   Wt 196 lb 14.4 oz (89.3 kg)   SpO2 97%   BMI 29.08 kg/m    GENERAL:alert, in no acute distress and comfortable SKIN: no acute rashes, no significant lesions EYES: conjunctiva are pink and non-injected, sclera anicteric OROPHARYNX: MMM, no exudates, no oropharyngeal erythema or ulceration NECK: supple, no JVD LYMPH:  no palpable lymphadenopathy in the cervical, axillary or inguinal regions LUNGS: clear to auscultation b/l with normal respiratory effort HEART: regular rate & rhythm ABDOMEN:  normoactive bowel sounds , non tender, not distended. Extremity: no pedal edema PSYCH: alert & oriented x 3 with fluent speech NEURO: no focal motor/sensory deficits  LABORATORY DATA:  I have reviewed the data as listed  . CBC Latest Ref Rng & Units 08/08/2020 08/09/2019 02/09/2019  WBC 4.0 - 10.5 K/uL 5.7 4.2 4.6  Hemoglobin 13.0 - 17.0 g/dL 15.0 15.1 14.4  Hematocrit 39.0 - 52.0 % 43.5 44.1 41.8  Platelets 150 - 400 K/uL 257 241 233    . CMP Latest Ref Rng & Units 08/08/2020 08/09/2019 02/09/2019  Glucose 70 - 99 mg/dL 61(L) 135(H) 155(H)  BUN 8 - 23 mg/dL 18 18 18   Creatinine 0.61 - 1.24 mg/dL 1.25(H) 1.17 1.19  Sodium 135 - 145 mmol/L 137 138 139  Potassium 3.5 - 5.1 mmol/L 4.4 4.4 4.0  Chloride 98 - 111 mmol/L 107 107 107  CO2 22 - 32 mmol/L 24 25 23   Calcium 8.9 - 10.3 mg/dL 8.9 8.9 9.2  Total Protein 6.5 - 8.1 g/dL 7.2 6.9 7.0  Total Bilirubin 0.3 - 1.2 mg/dL 0.4 0.3 <0.2(L)  Alkaline Phos 38 - 126 U/L 77 85 76  AST 15 - 41 U/L 28 26 25   ALT 0 - 44 U/L 28 30 26    TSH 3.142  PATHOLOGY   Diagnosis 12/25/16   CONSULT SLIDE, LEFT NECK LEVEL  2 MALIGNANT CELLS PRESENT, CONSISTENT WITH SQUAMOUS CELL CARCINOMA. 04/16/2017  03/17/2017  02/20/2017   Surgical Pathology Tissue ExamResulted: 08/19/2017 4:44 PM [facility] Specimen Collected on  Lymph Node 08/15/2017 9:02 PM  Result Narrative    ACCESSION VOZDGU:[id number] RECEIVED: 08/15/2017 ORDERING PHYSICIAN:CHRISTOPHER A SULLIVAN , MD PATIENT NAME:Iverson, Josiah STARLEY SURGICAL PATHOLOGY REPORT  FINAL PATHOLOGIC DIAGNOSIS MICROSCOPIC EXAMINATION AND DIAGNOSIS   "LEFT LEVEL 3&5 LYMPH NODE", EXCISON: Metastatic squamous cell carcinoma involving one of eight lymph nodes (  1/8).    I have personally reviewed the slides and/or other related materials referenced, and have edited the report as part of my pathologic assessment and final interpretation.  Electronically Signed Out By: Sherlon Handing , MD 08/19/2017 16:44:05    RADIOGRAPHIC STUDIES: I have personally reviewed the radiological images as listed and agreed with the findings in the report.  PET/CT 07/16/2017 PET HEAD AND NECK CANCER (W/LOW DOSE CT)07/16/2017 [facility] Result Impression   1.Hypermetabolic left level III cervical lymph node consistent with metastatic disease.  2.Additional nonhypermetabolic subcentimeter left level V cervical lymph nodes are also noted. They are of questionable significance  3.Focal area of increased uptake in the posterior left liver, of questionable significance. Recommend MRI of the liver for further evaluation. 4.Apparent bladder wall thickening which may in part be due to underdistention. Consider urinalysis and urology consult if clinically indicated. 5.Ancillary CT findings as above.    PET 01/14/17 IMPRESSION: 1. Focal area of FDG uptake in the left lateral pharynx/upper left tonsillar region likely representing the primary neoplasm. 2. Metastatic hypermetabolic left level 2 neck nodes. No  other FDG positive nodal stations or contralateral adenopathy. 3. No findings for metastatic disease.   CT Soft Tissue W Contrast 01/03/17 IMPRESSION: 1. Abnormally enlarged, solid left level 2 lymph node up to 4.3 cm corresponds to the palpable area of concern. Other left level 2 and level 3 nodes are normal by size criteria but asymmetrically increased. No contralateral lymphadenopathy. No primary tumor identified. 2. Burtis Junes this is metastatic nodal disease from an occult pharyngeal/laryngeal primary. Less likely differential considerations are lymphoproliferative disorder, lymphoma, or metastatic disease from a regional skin cancer or a distant primary. 3. Tiny right apical lung nodules appear postinflammatory.   ASSESSMENT & PLAN:   Tracy [name] is a 70 y.o. 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 Only 10pk-yr h/o smoking and quit in 1982 patient is s/p TORS surgery to confirm that the left tonsils/BOT area is the primary lesion as suggested by PET/CT . -he has subsequently had radical lymph node dissection of his neck which shows 1 out of 21 lymph nodes with metastatic disease 5.1 cm without ENE. --patient had rpt PET/CT on 07/16/2017 which showed  hypermetabolic left level III cervical lymph node consistent with metastatic disease. Additional nonhypermetabolic subcentimeter left level V cervical lymph nodes are also noted. They are of questionable significance. Focal area of increased uptake in the posterior left liver, of questionable significance - on 08/15/2017 by Dr Conley Canal yielded metastatic squamous cell carcinoma in 1 out of 8 lymph nodes. -Completed adjuvant IMRT with Dr Isidore Moos  02/05/18 PET/CT which revealed  Resolution hypermetabolic LEFT level 2 lymph node. No 2 no hypermetabolic lymph nodes in the neck. 2. No residual  hypermetabolic activity in the hypopharynx. 3. Single new RIGHT upper lobe pulmonary nodule with mild metabolic activity is indeterminate. Recommend short-term CT follow-up to demonstrate stability/resolution or growth."  05/13/2018 CT Chest ([id number]) revealed "Radiation changes at the anterior lung apices. No suspicious pulmonary nodules."  PLAN: -Discussed pt labwork today, 08/08/20; blood counts and chemistries normal. -Advised pt there are no concerns at this time. Will continue to monitor. -Will have annual visits until year 5, prior to consolidation of care and labs with PCP. -The pt shows no clinical or lab progression/return of his squamous cell  carcinoma at this time. -No indication for further treatment at this time.   -Will see the pt back in 1 year with labs.  #3 Diabetes Plan -recommended drinking atleast 60OZ of water daily -close f/u with PCP to optimize DM2 management. -Recommend that PCP consider basal insulin like Lantus as opposed to NPH   FOLLOW UP: RTC with Dr Irene Limbo with labs in 12 months   The total time spent in the appointment was 20 minutes and more than 50% was on counseling and direct patient cares.   All of the patient's questions were answered with apparent satisfaction. The patient knows to call the clinic with any problems, questions or concerns.   Sullivan Lone MD [place] [facility]IV[facility] Hematology/Oncology Physician [facility]  (Office):       [phone] (Work cell):  [phone] (Fax):           [phone]  08/08/2020 11:23 AM  I, Reinaldo Raddle, am acting as scribe for Dr. Sullivan Lone, MD.     .I have reviewed the above documentation for accuracy and completeness, and I agree with the above. Brunetta Genera MD

## 2020-08-07 NOTE — Progress Notes (Signed)
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       BRIEF ONCOLOGIC HISTORY:    Oncology History  Cancer of tonsillar fossa ([place])  01/29/2017 Initial Diagnosis   Cancer of tonsillar fossa ([place])   09/02/2017 Cancer Staging   Staging form: Pharynx - HPV-Mediated Oropharynx, AJCC 8th Edition - Pathologic stage from 09/02/2017: Stage I (pT1, pN1, cM0, p16+) - Signed by [name], MD on 09/02/2017         INTERVAL HISTORY:    Tracy [name] is a 70 y.o. male with a diagnosis of a Stage I pTx,pN1,Mx HPV Positive Squamous Cell Carcinoma of the Neck. He presents to clinic today for routine follow up having last been seen on 08/09/2019 by Dr. Irene Limbo and on 09/17/2019 by Dr. Isidore Moos.  He will be seeing Dr. Sullivan Lone on 08/08/2020.  He reports that he is doing well and has no issues of concern today.     REVIEW OF SYSTEMS:   Visual changes:    no   Swelling of the face:    no   Swelling of the mouth:    no   Swelling of the throat:    no   Dental problems:    no   Skin changes at treatment site:  no   Dry mouth:     no   Difficulty talking:    no  Thickened saliva:    no   Increased mucus:    no  Difficulty opening mouth:   no  Fatigue:     no   Pain or difficulty swallowing or chewing: no    Loss of appetite:    no   Numbness of the ear:    no  Weakness raising the arm(s):  no  Lack of lip movement:    no  Facial disfigurement:    no  Numbness of face or neck:   no  Loss of voice or impaired speech:  no  Hearing difficulty:    no  Lymphedema:     no  Pain:      no      -Last ENT visit:    Unknown  -Last Rad Onc visit:    09/17/2019  -Last Dentist visit:   08/09/2019     CURRENT MEDICATIONS:    Current Outpatient Medications on File Prior to Visit  Medication Sig Dispense Refill  . atorvastatin (LIPITOR) 10 MG tablet TAKE 1 TABLET BY MOUTH ONCE A WEEK FOR HIGH CHOLESTEROL  3  . insulin aspart (NOVOLOG) 100 UNIT/ML injection Inject into the skin.    Marland Kitchen insulin NPH-regular Human (NOVOLIN 70/30) (70-30) 100 UNIT/ML injection 20 Units.     Marland Kitchen latanoprost (XALATAN) 0.005 % ophthalmic solution 1 drop at bedtime.     No current facility-administered medications on file prior to visit.        ALLERGIES:    Allergies  Allergen Reactions  . Bismuth Subsalicylate Other (See Comments)    ADRxn: Wife states pepto bismol turns his tongue black        ADDITIONAL REVIEW OF  SYSTEMS:    Review of Systems  Constitutional: Negative for chills, diaphoresis, fever, malaise/fatigue and weight loss.  HENT: Negative for ear discharge, ear pain, hearing loss, sinus pain, sore throat and tinnitus.   Respiratory: Negative for cough, sputum production and shortness of breath.   Cardiovascular: Negative for chest pain and palpitations.  Gastrointestinal: Negative for blood in stool, constipation, diarrhea, melena, nausea and vomiting.  Musculoskeletal: Negative for neck pain.  Skin: Negative for rash.  Neurological: Negative for dizziness, speech change, weakness and headaches.    PHYSICAL EXAM:    Vitals:   07/28/20 1528  Pulse: 74  Resp: 18  Temp: 98 F (36.7 C)  SpO2: 98%    Filed Weights   07/28/20 1528  Weight: 199 lb 9.6 oz (90.5 kg)    Weight Date  199.6 pounds 07/28/2020  193.4 pounds 09/17/2019  192.3 pounds 08/09/2019            Pre-treatment (RT consult date):     General: Tracy [name] is a 70 y.o. male who appears to be in no acute distress.? Tracy [name] is accompanied/unaccompanied today.    HEENT:    Head: atraumatic and normocephalic.?    Pupils: equal and reactive to  light.    Conjunctivae: clear without exudate.?    Sclerae: anicteric.    Oral mucosa: pink and [name] without lesions.?    Tongue: pink, [name], and midline.    Oropharynx: pink and [name], without lesions.   Lymph: No preauricular, postauricular, cervical, supraclavicular, or infraclavicular lymphadenopathy noted on palpation. ?   Neck: No palpable masses. Skin on neck is soft.    Cardiovascular: Regular rate and rhythm without murmurs, rubs, or gallops.Marland Kitchen   Respiratory: Clear to auscultation bilaterally without wheezes, rales, or rhonchi. Chest expansion symmetric without accessory muscle use; breathing non-labored.?   GI: Abdomen soft and round. Bowel sounds normoactive. No hepatosplenomegaly, masses,  tenderness, rebound, guarding, or distention.   GU: Deferred. ?   Neuro: No focal deficits. Steady gait. ?   Psych: Normal mood and affect for situation.   Extremities: No edema.?   Skin: Warm and dry. No rashes or lesions.      LABORATORY DATA:    The patient's complete chemistry panel returned showing:   Lab Results  Component Value Date   NA 138 08/09/2019   NA 132 (L) 04/21/2017   K 4.4 08/09/2019   K 4.6 04/21/2017   CL 107 08/09/2019   CO2 25 08/09/2019   CO2 25 04/21/2017   GLUCOSE 135 (H) 08/09/2019   GLUCOSE 505 (H) 04/21/2017   BUN 18 08/09/2019   BUN 17.4 04/21/2017   CREATININE 1.17 08/09/2019   CREATININE 1.4 (H) 04/21/2017   ALBUMIN 3.9 08/09/2019   ALBUMIN 3.8 04/21/2017   ALKPHOS 85 08/09/2019   ALKPHOS 83 04/21/2017   ALT 30 08/09/2019   ALT 41 04/21/2017   AST 26 08/09/2019   AST 24 04/21/2017   BILITOT 0.3 08/09/2019   BILITOT 0.39 04/21/2017        The patient's TSH returned at:  Lab Results  Component Value Date   TSH 3.142 08/09/2019        The patient's complete blood count returned showing:      Component Value Date/Time   WBC 4.2 08/09/2019 1149   RBC 4.78 08/09/2019 1149   HGB 15.1 08/09/2019 1149   HGB 15.3  02/03/2018 1055   HGB 15.0 04/21/2017 1410   HCT 44.1 08/09/2019 1149   HCT  44.0 04/21/2017 1410   PLT 241 08/09/2019 1149   PLT 214 02/03/2018 1055   PLT 221 04/21/2017 1410   MCV 92.3 08/09/2019 1149   MCV 90.8 04/21/2017 1410   MCH 31.6 08/09/2019 1149   MCHC 34.2 08/09/2019 1149   RDW 12.2 08/09/2019 1149   RDW 12.9 04/21/2017 1410   LYMPHSABS 1.1 08/09/2019 1149   LYMPHSABS 1.8 04/21/2017 1410   MONOABS 0.4 08/09/2019 1149   MONOABS 0.4 04/21/2017 1410   EOSABS 0.1 08/09/2019 1149   EOSABS 0.2 04/21/2017 1410   BASOSABS 0.0 08/09/2019 1149   BASOSABS 0.0 04/21/2017 1410        DIAGNOSTIC IMAGING:    No results found.       ASSESSMENT & PLAN:    Tracy [name] is a 70 y.o. male with a diagnosis of a Stage I pTx,pN1,Mx HPV Positive Squamous Cell Carcinoma of the Neck which was originally diagnosed on 12/25/2016 . He was treated with radiation therapy.  He presents to the survivorship clinic today for routine follow-up having last been seen by Dr. Isidore Moos on 09/17/2019.      1. Oral and oropharyngeal cancer ( Stage I pTx,pN1,Mx HPV Positive Squamous Cell Carcinoma of the Neck ):  Tracy [name] is clinically without evidence of residual or recurrence cancer on physical exam today.         2. Nutritional status: Tracy [name] reports that he is currently able to consume adequate nutrition by mouth.  His weight is stable at 199.6 lbs today.     3. At risk for dysphagia: Given Tracy [name] treatment for oral and oropharyngeal cancer ( Stage I pTx,pN1,Mx HPV Positive Squamous Cell Carcinoma of the Neck ), which included radiation therapy, he maybe at risk for chronic dysphagia.  He reports having no difficulty with foods. He is able to consume soft foods and liquids without difficulty.      4.  At risk for neck lymphedema:  When patients with head & neck cancers are treated with radiation therapy, there is an associated increased risk of neck lymphedema.  Tracy [name] reports that  currently he is not experiencing symptoms.      5.  At risk for hypothyroidism: The thyroid gland is often affected after treatment for head & neck cancer.  Tracy [name] most recent TSH was  Lab Results  Component Value Date   TSH 3.142 08/09/2019   on 08/09/2019.   We discussed that he will continue to have serial TSH monitoring for at least the next 5 years as part of his routine follow-up and post-cancer treatment care.        6. At risk for tooth decay/dental concerns: After treatment with radiation for head & neck cancers, patients often experience dry mouth which increases their risk of dental caries. Tracy [name] was encouraged to see his dentist 3-4 times per year.      7.  Lung cancer screening:  [facility] now offers eligible patients lung cancer screening with a low-dose chest CT to aid in early detection, provide more effective treatment options, and ultimately improve survival benefits for patients diagnosed early.  Below is the selection criteria for screening:    Medicare patients: 55-77 years; privately insured patients 55-80 years.   Active or former smokers who have quit within the last 15 years.   30+ pack-year history of smoking    Exclusion criteria - No signs/symptoms of lung cancer (i.e., no recent history of hemoptysis and no unexplained weight loss >  15 pounds in the last 6 months).   Willing and healthy enough to undergo biopsies/surgery if needed.   Tracy [name] currently does not meet criteria for lung cancer screening.  Therefore, I have not placed a referral for screening today.        #. Support services/counseling: It is not uncommon for this period of the patient's cancer care trajectory to be one of many emotions and stressors.  We discussed an opportunity for him to participate in the next session of Head & Neck FYNN ("Finding Your New Normal") support group series, designed for patients after they have completed treatment.   Tracy [name] was encouraged to take  advantage of our many other support services programs, support groups, and/or counseling in coping with his new life as a cancer survivor after completing anti-cancer treatment. The patient was offered support today through active listening and expressive supportive counseling.        DISPOSITION:    -See Dr. Isidore Moos (ENT) : no follow up appointment yet scheduled  -Return to cancer center to see Dr. Irene Limbo in March,2022  -Return to cancer center to see Survivorship NP in 6 months.        08/07/20   2:48 PM        NOTE: PRIMARY CARE PROVIDER   [name], MD   Phone: [phone]   Fax: [phone]

## 2020-08-08 ENCOUNTER — Telehealth: Payer: Self-pay | Admitting: Hematology

## 2020-08-08 ENCOUNTER — Other Ambulatory Visit: Payer: Self-pay

## 2020-08-08 ENCOUNTER — Inpatient Hospital Stay: Payer: Medicare Other | Attending: Medical

## 2020-08-08 ENCOUNTER — Inpatient Hospital Stay (HOSPITAL_BASED_OUTPATIENT_CLINIC_OR_DEPARTMENT_OTHER): Payer: Medicare Other | Admitting: Hematology

## 2020-08-08 VITALS — BP 117/68 | HR 64 | Temp 97.5°F | Resp 17 | Ht 69.0 in | Wt 196.9 lb

## 2020-08-08 DIAGNOSIS — C770 Secondary and unspecified malignant neoplasm of lymph nodes of head, face and neck: Secondary | ICD-10-CM | POA: Insufficient documentation

## 2020-08-08 DIAGNOSIS — Z87891 Personal history of nicotine dependence: Secondary | ICD-10-CM | POA: Diagnosis not present

## 2020-08-08 DIAGNOSIS — C760 Malignant neoplasm of head, face and neck: Secondary | ICD-10-CM | POA: Insufficient documentation

## 2020-08-08 DIAGNOSIS — C090 Malignant neoplasm of tonsillar fossa: Secondary | ICD-10-CM

## 2020-08-08 DIAGNOSIS — E119 Type 2 diabetes mellitus without complications: Secondary | ICD-10-CM | POA: Diagnosis not present

## 2020-08-08 DIAGNOSIS — E079 Disorder of thyroid, unspecified: Secondary | ICD-10-CM

## 2020-08-08 DIAGNOSIS — B977 Papillomavirus as the cause of diseases classified elsewhere: Secondary | ICD-10-CM | POA: Insufficient documentation

## 2020-08-08 LAB — CBC WITH DIFFERENTIAL/PLATELET
Abs Immature Granulocytes: 0.02 10*3/uL (ref 0.00–0.07)
Basophils Absolute: 0 10*3/uL (ref 0.0–0.1)
Basophils Relative: 1 %
Eosinophils Absolute: 0.3 10*3/uL (ref 0.0–0.5)
Eosinophils Relative: 5 %
HCT: 43.5 % (ref 39.0–52.0)
Hemoglobin: 15 g/dL (ref 13.0–17.0)
Immature Granulocytes: 0 %
Lymphocytes Relative: 31 %
Lymphs Abs: 1.8 10*3/uL (ref 0.7–4.0)
MCH: 31.6 pg (ref 26.0–34.0)
MCHC: 34.5 g/dL (ref 30.0–36.0)
MCV: 91.6 fL (ref 80.0–100.0)
Monocytes Absolute: 0.6 10*3/uL (ref 0.1–1.0)
Monocytes Relative: 10 %
Neutro Abs: 3.1 10*3/uL (ref 1.7–7.7)
Neutrophils Relative %: 53 %
Platelets: 257 10*3/uL (ref 150–400)
RBC: 4.75 MIL/uL (ref 4.22–5.81)
RDW: 12.5 % (ref 11.5–15.5)
WBC: 5.7 10*3/uL (ref 4.0–10.5)
nRBC: 0 % (ref 0.0–0.2)

## 2020-08-08 LAB — CMP (CANCER CENTER ONLY)
ALT: 28 U/L (ref 0–44)
AST: 28 U/L (ref 15–41)
Albumin: 4 g/dL (ref 3.5–5.0)
Alkaline Phosphatase: 77 U/L (ref 38–126)
Anion gap: 6 (ref 5–15)
BUN: 18 mg/dL (ref 8–23)
CO2: 24 mmol/L (ref 22–32)
Calcium: 8.9 mg/dL (ref 8.9–10.3)
Chloride: 107 mmol/L (ref 98–111)
Creatinine: 1.25 mg/dL — ABNORMAL HIGH (ref 0.61–1.24)
GFR, Estimated: >60 mL/min (ref >60)
Glucose, Bld: 61 mg/dL — ABNORMAL LOW (ref 70–99)
Potassium: 4.4 mmol/L (ref 3.5–5.1)
Sodium: 137 mmol/L (ref 135–145)
Total Bilirubin: 0.4 mg/dL (ref 0.3–1.2)
Total Protein: 7.2 g/dL (ref 6.5–8.1)

## 2020-08-08 LAB — TSH: TSH: 3.555 u[IU]/mL (ref 0.320–4.118)

## 2020-08-08 NOTE — Telephone Encounter (Signed)
Scheduled per los. Gave avs and calendar  

## 2020-08-21 DIAGNOSIS — Z9889 Other specified postprocedural states: Secondary | ICD-10-CM | POA: Diagnosis not present

## 2020-08-21 DIAGNOSIS — C099 Malignant neoplasm of tonsil, unspecified: Secondary | ICD-10-CM | POA: Diagnosis not present

## 2020-08-21 DIAGNOSIS — Z87891 Personal history of nicotine dependence: Secondary | ICD-10-CM | POA: Diagnosis not present

## 2020-08-21 DIAGNOSIS — Z85818 Personal history of malignant neoplasm of other sites of lip, oral cavity, and pharynx: Secondary | ICD-10-CM | POA: Diagnosis not present

## 2020-08-21 DIAGNOSIS — Z08 Encounter for follow-up examination after completed treatment for malignant neoplasm: Secondary | ICD-10-CM | POA: Diagnosis not present

## 2020-08-21 DIAGNOSIS — Z888 Allergy status to other drugs, medicaments and biological substances status: Secondary | ICD-10-CM | POA: Diagnosis not present

## 2020-09-13 DIAGNOSIS — E119 Type 2 diabetes mellitus without complications: Secondary | ICD-10-CM | POA: Diagnosis not present

## 2020-10-13 DIAGNOSIS — H6122 Impacted cerumen, left ear: Secondary | ICD-10-CM | POA: Diagnosis not present

## 2020-10-13 DIAGNOSIS — I700 Atherosclerosis of aorta: Secondary | ICD-10-CM | POA: Diagnosis not present

## 2020-10-13 DIAGNOSIS — Z6829 Body mass index (BMI) 29.0-29.9, adult: Secondary | ICD-10-CM | POA: Diagnosis not present

## 2020-10-25 DIAGNOSIS — E1165 Type 2 diabetes mellitus with hyperglycemia: Secondary | ICD-10-CM | POA: Diagnosis not present

## 2020-10-25 DIAGNOSIS — E7849 Other hyperlipidemia: Secondary | ICD-10-CM | POA: Diagnosis not present

## 2020-10-25 DIAGNOSIS — I700 Atherosclerosis of aorta: Secondary | ICD-10-CM | POA: Diagnosis not present

## 2020-10-25 DIAGNOSIS — I10 Essential (primary) hypertension: Secondary | ICD-10-CM | POA: Diagnosis not present

## 2020-10-25 DIAGNOSIS — Z0001 Encounter for general adult medical examination with abnormal findings: Secondary | ICD-10-CM | POA: Diagnosis not present

## 2020-11-01 DIAGNOSIS — Z6828 Body mass index (BMI) 28.0-28.9, adult: Secondary | ICD-10-CM | POA: Diagnosis not present

## 2020-11-01 DIAGNOSIS — L03211 Cellulitis of face: Secondary | ICD-10-CM | POA: Diagnosis not present

## 2020-11-20 DIAGNOSIS — R5382 Chronic fatigue, unspecified: Secondary | ICD-10-CM | POA: Diagnosis not present

## 2020-11-20 DIAGNOSIS — Z1329 Encounter for screening for other suspected endocrine disorder: Secondary | ICD-10-CM | POA: Diagnosis not present

## 2020-11-20 DIAGNOSIS — E1165 Type 2 diabetes mellitus with hyperglycemia: Secondary | ICD-10-CM | POA: Diagnosis not present

## 2020-11-20 DIAGNOSIS — N400 Enlarged prostate without lower urinary tract symptoms: Secondary | ICD-10-CM | POA: Diagnosis not present

## 2020-11-20 DIAGNOSIS — E782 Mixed hyperlipidemia: Secondary | ICD-10-CM | POA: Diagnosis not present

## 2020-11-20 DIAGNOSIS — I10 Essential (primary) hypertension: Secondary | ICD-10-CM | POA: Diagnosis not present

## 2020-11-20 DIAGNOSIS — E7849 Other hyperlipidemia: Secondary | ICD-10-CM | POA: Diagnosis not present

## 2020-11-23 DIAGNOSIS — I10 Essential (primary) hypertension: Secondary | ICD-10-CM | POA: Diagnosis not present

## 2020-11-23 DIAGNOSIS — E7849 Other hyperlipidemia: Secondary | ICD-10-CM | POA: Diagnosis not present

## 2020-11-23 DIAGNOSIS — I700 Atherosclerosis of aorta: Secondary | ICD-10-CM | POA: Diagnosis not present

## 2020-11-23 DIAGNOSIS — Z6829 Body mass index (BMI) 29.0-29.9, adult: Secondary | ICD-10-CM | POA: Diagnosis not present

## 2020-11-23 DIAGNOSIS — E1165 Type 2 diabetes mellitus with hyperglycemia: Secondary | ICD-10-CM | POA: Diagnosis not present

## 2020-11-23 DIAGNOSIS — Z23 Encounter for immunization: Secondary | ICD-10-CM | POA: Diagnosis not present

## 2021-02-21 DIAGNOSIS — C099 Malignant neoplasm of tonsil, unspecified: Secondary | ICD-10-CM | POA: Diagnosis not present

## 2021-02-21 DIAGNOSIS — Z85818 Personal history of malignant neoplasm of other sites of lip, oral cavity, and pharynx: Secondary | ICD-10-CM | POA: Diagnosis not present

## 2021-02-21 DIAGNOSIS — Z08 Encounter for follow-up examination after completed treatment for malignant neoplasm: Secondary | ICD-10-CM | POA: Diagnosis not present

## 2021-02-21 DIAGNOSIS — Z87891 Personal history of nicotine dependence: Secondary | ICD-10-CM | POA: Diagnosis not present

## 2021-03-12 DIAGNOSIS — C099 Malignant neoplasm of tonsil, unspecified: Secondary | ICD-10-CM | POA: Diagnosis not present

## 2021-03-14 DIAGNOSIS — H401132 Primary open-angle glaucoma, bilateral, moderate stage: Secondary | ICD-10-CM | POA: Diagnosis not present

## 2021-03-16 DIAGNOSIS — R5383 Other fatigue: Secondary | ICD-10-CM | POA: Diagnosis not present

## 2021-03-16 DIAGNOSIS — E7849 Other hyperlipidemia: Secondary | ICD-10-CM | POA: Diagnosis not present

## 2021-03-16 DIAGNOSIS — E782 Mixed hyperlipidemia: Secondary | ICD-10-CM | POA: Diagnosis not present

## 2021-03-16 DIAGNOSIS — E1165 Type 2 diabetes mellitus with hyperglycemia: Secondary | ICD-10-CM | POA: Diagnosis not present

## 2021-03-16 DIAGNOSIS — I10 Essential (primary) hypertension: Secondary | ICD-10-CM | POA: Diagnosis not present

## 2021-03-22 DIAGNOSIS — I700 Atherosclerosis of aorta: Secondary | ICD-10-CM | POA: Diagnosis not present

## 2021-03-22 DIAGNOSIS — I10 Essential (primary) hypertension: Secondary | ICD-10-CM | POA: Diagnosis not present

## 2021-03-22 DIAGNOSIS — Z23 Encounter for immunization: Secondary | ICD-10-CM | POA: Diagnosis not present

## 2021-03-22 DIAGNOSIS — E1165 Type 2 diabetes mellitus with hyperglycemia: Secondary | ICD-10-CM | POA: Diagnosis not present

## 2021-03-22 DIAGNOSIS — E7849 Other hyperlipidemia: Secondary | ICD-10-CM | POA: Diagnosis not present

## 2021-03-22 DIAGNOSIS — Z6829 Body mass index (BMI) 29.0-29.9, adult: Secondary | ICD-10-CM | POA: Diagnosis not present

## 2021-05-01 ENCOUNTER — Ambulatory Visit (INDEPENDENT_AMBULATORY_CARE_PROVIDER_SITE_OTHER): Payer: Medicare Other | Admitting: Physician Assistant

## 2021-05-01 ENCOUNTER — Encounter: Payer: Self-pay | Admitting: Physician Assistant

## 2021-05-01 ENCOUNTER — Other Ambulatory Visit: Payer: Self-pay

## 2021-05-01 ENCOUNTER — Ambulatory Visit (INDEPENDENT_AMBULATORY_CARE_PROVIDER_SITE_OTHER): Payer: Medicare Other

## 2021-05-01 DIAGNOSIS — M25561 Pain in right knee: Secondary | ICD-10-CM | POA: Diagnosis not present

## 2021-05-01 MED ORDER — METHYLPREDNISOLONE ACETATE 40 MG/ML IJ SUSP
40.0000 mg | INTRAMUSCULAR | Status: AC | PRN
  Administered 2021-05-01: 40 mg via INTRA_ARTICULAR

## 2021-05-01 MED ORDER — LIDOCAINE HCL 1 % IJ SOLN
5.0000 mL | INTRAMUSCULAR | Status: AC | PRN
  Administered 2021-05-01: 5 mL

## 2021-05-01 NOTE — Progress Notes (Signed)
Office Visit Note   Patient: Tracy [name]           Date of Birth: [birth year]           MRN: [id number] Visit Date: 05/01/2021              Requested by: [name], MD [place],  [place] [id number] PCP: [name], MD  Chief Complaint  Patient presents with   Left Heel - Follow-up, Pain   Right Knee - Pain      HPI: Patient is a pleasant 70 year old gentleman who I have seen before for left heel pain.  Today he comes in complaining of right knee pain.  He said in his job about a month ago he did repetitive pushing with his right knee and twisting.  He reports doing this over 400 times a day.  After this he began to notice soreness globally in his knee.  Some pain went down his leg but no paresthesias no weakness.  He is tried topical anti-inflammatories which have helped minimally.  Denies any instability denies any locking or catching.  Noticed the pain after he has been sitting for a while or when he gets up in the morning and his knee also notices it if he tries to squat deeply  Assessment & Plan: Visit Diagnoses:  1. Acute pain of right knee     Plan: Findings consistent with early arthritis aggravated by repetitive motion.  Recommended a injection today.  He is a diabetic and understands this may temporarily elevate his blood sugars.  May follow-up if this does not improve  Follow-Up Instructions: No follow-ups on file.   Ortho Exam  Patient is alert, oriented, no adenopathy, well-dressed, normal affect, normal respiratory effort. Examination of his right knee: No effusion no swelling some aching over the posterior knee mild tenderness medially laterally good varus and valgus stability excellent endpoint on anterior draw.  Lower compartments are soft nontender negative Homans on passive stretch  Imaging: XR Knee 1-2 Views Right  Result Date: 05/01/2021 2 view radiographs of his right knee demonstrate overall well-maintained alignment.  Overall joint  spaces are well-preserved without any chondrocalcinosis.  He does have an old fragment secondary to Osgood-Schlatter's no acute bony changes mild varus alignment  No images are attached to the encounter.  Labs: No results found for: HGBA1C, ESRSEDRATE, CRP, LABURIC, REPTSTATUS, GRAMSTAIN, CULT, LABORGA   Lab Results  Component Value Date   ALBUMIN 4.0 08/08/2020   ALBUMIN 3.9 08/09/2019   ALBUMIN 4.0 02/09/2019    Lab Results  Component Value Date   MG 2.1 08/07/2018   MG 2.2 04/21/2017   No results found for: VD25OH  No results found for: PREALBUMIN CBC EXTENDED Latest Ref Rng & Units 08/08/2020 08/09/2019 02/09/2019  WBC 4.0 - 10.5 K/uL 5.7 4.2 4.6  RBC 4.22 - 5.81 MIL/uL 4.75 4.78 4.56  HGB 13.0 - 17.0 g/dL 15.0 15.1 14.4  HCT 39.0 - 52.0 % 43.5 44.1 41.8  PLT 150 - 400 K/uL 257 241 233  NEUTROABS 1.7 - 7.7 K/uL 3.1 2.5 3.0  LYMPHSABS 0.7 - 4.0 K/uL 1.8 1.1 1.0     There is no height or weight on file to calculate BMI.  Orders:  Orders Placed This Encounter  Procedures   XR Knee 1-2 Views Right   No orders of the defined types were placed in this encounter.    Procedures: Large Joint Inj: R knee on 05/01/2021 11:19  AM Indications: pain and diagnostic evaluation Details: 25 G 1.5 in needle, anteromedial approach  Arthrogram: No  Medications: 40 mg methylPREDNISolone acetate 40 MG/ML; 5 mL lidocaine 1 % Outcome: tolerated well, no immediate complications Procedure, treatment alternatives, risks and benefits explained, specific risks discussed. Consent was given by the patient. Immediately prior to procedure a time out was called to verify the correct patient, procedure, equipment, support staff and site/side marked as required. Patient was prepped and draped in the usual sterile fashion.     Clinical Data: No additional findings.  ROS:  All other systems negative, except as noted in the HPI. Review of Systems  Objective: Vital Signs: There were no vitals  taken for this visit.  Specialty Comments:  No specialty comments available.  PMFS History: Patient Active Problem List   Diagnosis Date Noted   Squamous cell carcinoma of neck 08/16/2017   DM (diabetes mellitus), type 2 ([place]) 03/12/2017   Metastatic squamous cell carcinoma involving lymph node with unknown primary site ([place]) 02/11/2017   Cancer of tonsillar fossa ([place]) 01/29/2017   Neck mass 12/24/2016   Presbycusis of both ears 12/24/2016   Past Medical History:  Diagnosis Date   Diabetes mellitus ([place])    Glaucoma    History of radiation therapy 09/24/17- 11/05/17   head and neck, Left tonsil/ 60 gy delivered in 30 fractions of 2 Gy.    Kidney stones 1985   Neck mass 12/24/2016    History reviewed. No pertinent family history.  Past Surgical History:  Procedure Laterality Date   AMPUTATION Right    5th digit, 1/2 finger   FINE NEEDLE ASPIRATION  12/25/2016   Left neck, Level 2   NASAL SEPTUM SURGERY     remotely   TOE SURGERY     broken toe with bone ground down.    Social History   Occupational History   Not on file  Tobacco Use   Smoking status: Former    Packs/day: 1.00    Years: 10.00    Pack years: 10.00    Types: Cigarettes    Quit date: 06/03/1985    Years since quitting: 35.9   Smokeless tobacco: Former    Types: Chew    Quit date: 06/03/1985  Vaping Use   Vaping Use: Never used  Substance and Sexual Activity   Alcohol use: No   Drug use: No   Sexual activity: Not on file

## 2021-07-17 DIAGNOSIS — E7849 Other hyperlipidemia: Secondary | ICD-10-CM | POA: Diagnosis not present

## 2021-07-17 DIAGNOSIS — E782 Mixed hyperlipidemia: Secondary | ICD-10-CM | POA: Diagnosis not present

## 2021-07-17 DIAGNOSIS — I10 Essential (primary) hypertension: Secondary | ICD-10-CM | POA: Diagnosis not present

## 2021-07-17 DIAGNOSIS — Z1329 Encounter for screening for other suspected endocrine disorder: Secondary | ICD-10-CM | POA: Diagnosis not present

## 2021-07-17 DIAGNOSIS — E1165 Type 2 diabetes mellitus with hyperglycemia: Secondary | ICD-10-CM | POA: Diagnosis not present

## 2021-07-24 DIAGNOSIS — E1165 Type 2 diabetes mellitus with hyperglycemia: Secondary | ICD-10-CM | POA: Diagnosis not present

## 2021-07-24 DIAGNOSIS — E7849 Other hyperlipidemia: Secondary | ICD-10-CM | POA: Diagnosis not present

## 2021-07-24 DIAGNOSIS — I10 Essential (primary) hypertension: Secondary | ICD-10-CM | POA: Diagnosis not present

## 2021-07-24 DIAGNOSIS — Z6829 Body mass index (BMI) 29.0-29.9, adult: Secondary | ICD-10-CM | POA: Diagnosis not present

## 2021-07-24 DIAGNOSIS — I700 Atherosclerosis of aorta: Secondary | ICD-10-CM | POA: Diagnosis not present

## 2021-08-06 ENCOUNTER — Telehealth: Payer: Self-pay | Admitting: Hematology

## 2021-08-06 NOTE — Telephone Encounter (Signed)
Scheduled per 3/6 in basket, pt has been called and confirmed new appt ?

## 2021-08-07 ENCOUNTER — Ambulatory Visit: Payer: Medicare Other | Admitting: Hematology

## 2021-08-07 ENCOUNTER — Inpatient Hospital Stay: Payer: Medicare Other

## 2021-08-08 ENCOUNTER — Inpatient Hospital Stay: Payer: Medicare Other | Admitting: Hematology

## 2021-08-08 ENCOUNTER — Inpatient Hospital Stay: Payer: Medicare Other

## 2021-08-13 ENCOUNTER — Other Ambulatory Visit: Payer: Self-pay

## 2021-08-13 DIAGNOSIS — C090 Malignant neoplasm of tonsillar fossa: Secondary | ICD-10-CM

## 2021-08-14 ENCOUNTER — Other Ambulatory Visit: Payer: Self-pay

## 2021-08-14 ENCOUNTER — Inpatient Hospital Stay (HOSPITAL_BASED_OUTPATIENT_CLINIC_OR_DEPARTMENT_OTHER): Payer: Medicare Other | Admitting: Hematology

## 2021-08-14 ENCOUNTER — Inpatient Hospital Stay: Payer: Medicare Other | Attending: Hematology

## 2021-08-14 VITALS — BP 124/71 | HR 71 | Temp 97.9°F | Resp 17 | Ht 69.0 in | Wt 199.1 lb

## 2021-08-14 DIAGNOSIS — Z800 Family history of malignant neoplasm of digestive organs: Secondary | ICD-10-CM | POA: Insufficient documentation

## 2021-08-14 DIAGNOSIS — Z923 Personal history of irradiation: Secondary | ICD-10-CM | POA: Insufficient documentation

## 2021-08-14 DIAGNOSIS — C770 Secondary and unspecified malignant neoplasm of lymph nodes of head, face and neck: Secondary | ICD-10-CM | POA: Insufficient documentation

## 2021-08-14 DIAGNOSIS — B977 Papillomavirus as the cause of diseases classified elsewhere: Secondary | ICD-10-CM | POA: Diagnosis not present

## 2021-08-14 DIAGNOSIS — Z8042 Family history of malignant neoplasm of prostate: Secondary | ICD-10-CM | POA: Insufficient documentation

## 2021-08-14 DIAGNOSIS — Z794 Long term (current) use of insulin: Secondary | ICD-10-CM | POA: Insufficient documentation

## 2021-08-14 DIAGNOSIS — Z808 Family history of malignant neoplasm of other organs or systems: Secondary | ICD-10-CM | POA: Insufficient documentation

## 2021-08-14 DIAGNOSIS — E119 Type 2 diabetes mellitus without complications: Secondary | ICD-10-CM | POA: Insufficient documentation

## 2021-08-14 DIAGNOSIS — Z806 Family history of leukemia: Secondary | ICD-10-CM | POA: Insufficient documentation

## 2021-08-14 DIAGNOSIS — C090 Malignant neoplasm of tonsillar fossa: Secondary | ICD-10-CM | POA: Diagnosis not present

## 2021-08-14 DIAGNOSIS — Z87891 Personal history of nicotine dependence: Secondary | ICD-10-CM | POA: Diagnosis not present

## 2021-08-14 DIAGNOSIS — C760 Malignant neoplasm of head, face and neck: Secondary | ICD-10-CM | POA: Insufficient documentation

## 2021-08-14 LAB — CBC WITH DIFFERENTIAL (CANCER CENTER ONLY)
Abs Immature Granulocytes: 0.02 10*3/uL (ref 0.00–0.07)
Basophils Absolute: 0 10*3/uL (ref 0.0–0.1)
Basophils Relative: 0 %
Eosinophils Absolute: 0.2 10*3/uL (ref 0.0–0.5)
Eosinophils Relative: 3 %
HCT: 42.1 % (ref 39.0–52.0)
Hemoglobin: 14.5 g/dL (ref 13.0–17.0)
Immature Granulocytes: 0 %
Lymphocytes Relative: 25 %
Lymphs Abs: 1.4 10*3/uL (ref 0.7–4.0)
MCH: 31.8 pg (ref 26.0–34.0)
MCHC: 34.4 g/dL (ref 30.0–36.0)
MCV: 92.3 fL (ref 80.0–100.0)
Monocytes Absolute: 0.5 10*3/uL (ref 0.1–1.0)
Monocytes Relative: 8 %
Neutro Abs: 3.6 10*3/uL (ref 1.7–7.7)
Neutrophils Relative %: 64 %
Platelet Count: 260 10*3/uL (ref 150–400)
RBC: 4.56 MIL/uL (ref 4.22–5.81)
RDW: 12.8 % (ref 11.5–15.5)
WBC Count: 5.6 10*3/uL (ref 4.0–10.5)
nRBC: 0 % (ref 0.0–0.2)

## 2021-08-14 LAB — CMP (CANCER CENTER ONLY)
ALT: 26 U/L (ref 0–44)
AST: 25 U/L (ref 15–41)
Albumin: 4.2 g/dL (ref 3.5–5.0)
Alkaline Phosphatase: 75 U/L (ref 38–126)
Anion gap: 7 (ref 5–15)
BUN: 17 mg/dL (ref 8–23)
CO2: 27 mmol/L (ref 22–32)
Calcium: 9.6 mg/dL (ref 8.9–10.3)
Chloride: 104 mmol/L (ref 98–111)
Creatinine: 1.38 mg/dL — ABNORMAL HIGH (ref 0.61–1.24)
GFR, Estimated: 55 mL/min — ABNORMAL LOW (ref >60)
Glucose, Bld: 100 mg/dL — ABNORMAL HIGH (ref 70–99)
Potassium: 4.1 mmol/L (ref 3.5–5.1)
Sodium: 138 mmol/L (ref 135–145)
Total Bilirubin: 0.3 mg/dL (ref 0.3–1.2)
Total Protein: 7 g/dL (ref 6.5–8.1)

## 2021-08-14 NOTE — Progress Notes (Signed)
? ?HEMATOLOGY/ONCOLOGY CLINIC NOTE ? ?Date of Service: 08/14/2021 ? ?Patient Care Team: ?Tracy Bis, MD as PCP - General (Family Medicine) ?Tracy Gala, MD as Consulting Physician (Otolaryngology) ?Tracy Gibson, MD as Attending Physician (Radiation Oncology) ?Tracy Sauers, RN (Inactive) as Oncology Nurse Navigator ? 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 ?HISTORY OF PRESENTING ILLNESS:  ?-plz see previous note for details on HPI ? ?INTERVAL HISTORY ? ?Tracy [name] is a wonderful 71 y.o. male who is here for evaluation and management of Squamous Cell Carcinoma of the Head and Neck. The patient's last visit with Tracy [name] was on 08/08/2020. The pt reports that he is doing well overall. ? ?The pt reports no new concerns or symptoms. He has been gaining some weight. ? ?Lab results today 08/14/2021 of CBC w/diff and CMP is as follows: all values are WNL except for Glucose of 100, Creatinine of 1.38. ? ?No new or acute symptoms. He reports feeling well. ?No lumps, bumps, or rashes. ? ?He expresses his plantar fasciitis has improved. ? ?MEDICAL HISTORY:  ?Past Medical History:  ?Diagnosis Date  ? Diabetes mellitus (Tracy [name])   ? Glaucoma   ? History of radiation therapy 09/24/17- 11/05/17  ? head and neck, Left tonsil/ 60 gy delivered in 30 fractions of 2 Gy.   ? Kidney stones 1985  ? Neck mass 12/24/2016  ? ? ?SURGICAL HISTORY: ?Past Surgical History:  ?Procedure Laterality Date  ? AMPUTATION Right   ? 5th digit, 1/2 finger  ? FINE NEEDLE ASPIRATION  12/25/2016  ? Left neck, Level 2  ? NASAL SEPTUM SURGERY    ? remotely  ? TOE SURGERY    ? broken toe with bone ground down.   ? ? ?SOCIAL HISTORY: ?Social History  ? ?Socioeconomic History  ? Marital status: Married  ?  Spouse name: Tracy [name]  ? Number of children: 5  ? Years of education: Not on file  ? Highest education level: Not on file  ?Occupational History  ? Not on file  ?Tobacco Use  ? Smoking status:  Former  ?  Packs/day: 1.00  ?  Years: 10.00  ?  Pack years: 10.00  ?  Types: Cigarettes  ?  Quit date: 06/03/1985  ?  Years since quitting: 36.2  ? Smokeless tobacco: Former  ?  Types: Chew  ?  Quit date: 06/03/1985  ?Vaping Use  ? Vaping Use: Never used  ?Substance and Sexual Activity  ? Alcohol use: No  ? Drug use: No  ? Sexual activity: Not on file  ?Other Topics Concern  ? Not on file  ?Social History Narrative  ? Not on file  ? ?Social Determinants of Health  ? ?Financial Resource Strain: Not on file  ?Food Insecurity: Not on file  ?Transportation Needs: Not on file  ?Physical Activity: Not on file  ?Stress: Not on file  ?Social Connections: Not on file  ?Intimate Partner Violence: Not on file  ? ? ?FAMILY HISTORY: ?His mother had mucosal melanoma and father had prostate cancer. ?His sister had stage 4 pancreatic cancer. ? His mother and father had DM.  ?His maternal grandfather had leukemia.  ?His maternal uncle had brain cancer.  ? ?ALLERGIES:  is allergic to bismuth subsalicylate. ? ?MEDICATIONS:  ?Current Outpatient Medications  ?Medication Sig Dispense Refill  ? atorvastatin (LIPITOR) 10 MG tablet TAKE 1 TABLET BY MOUTH ONCE A WEEK FOR HIGH CHOLESTEROL  3  ? insulin aspart (NOVOLOG)  100 UNIT/ML injection Inject into the skin.    ? insulin NPH-regular Human (NOVOLIN 70/30) (70-30) 100 UNIT/ML injection 20 Units.     ? latanoprost (XALATAN) 0.005 % ophthalmic solution 1 drop at bedtime.    ? ?No current facility-administered medications for this visit.  ? ? ?REVIEW OF SYSTEMS:   ?10 Point review of Systems was done is negative except as noted above. ? ?PHYSICAL EXAMINATION: ?ECOG PERFORMANCE STATUS: 1 - Symptomatic but completely ambulatory ? ?.BP 124/71 (BP Location: Left Arm, Patient Position: Sitting)   Pulse 71   Temp 97.9 ?F (36.6 ?C) (Temporal)   Resp 17   Ht '5\' 9"'$  (1.753 m)   Wt 199 lb 1.6 oz (90.3 kg)   SpO2 (!) 17%   BMI 29.40 kg/m?   ? ?GENERAL:alert, in no acute distress and comfortable ?SKIN:  no acute rashes, no significant lesions ?EYES: conjunctiva are pink and non-injected, sclera anicteric ?NECK: supple, no JVD ?LYMPH:  no palpable lymphadenopathy in the cervical, axillary or inguinal regions ?LUNGS: clear to auscultation b/l with normal respiratory effort ?HEART: regular rate & rhythm ?ABDOMEN:  normoactive bowel sounds , non tender, not distended. ?Extremity: no pedal edema ?PSYCH: alert & oriented x 3 with fluent speech ?NEURO: no focal motor/sensory deficits ? ?LABORATORY DATA:  ?I have reviewed the data as listed ? ?. ?CBC Latest Ref Rng & Units 08/14/2021 08/08/2020 08/09/2019  ?WBC 4.0 - 10.5 K/uL 5.6 5.7 4.2  ?Hemoglobin 13.0 - 17.0 g/dL 14.5 15.0 15.1  ?Hematocrit 39.0 - 52.0 % 42.1 43.5 44.1  ?Platelets 150 - 400 K/uL 260 257 241  ? ? ?. ?CMP Latest Ref Rng & Units 08/14/2021 08/08/2020 08/09/2019  ?Glucose 70 - 99 mg/dL 100(H) 61(L) 135(H)  ?BUN 8 - 23 mg/dL '17 18 18  '$ ?Creatinine 0.61 - 1.24 mg/dL 1.38(H) 1.25(H) 1.17  ?Sodium 135 - 145 mmol/L 138 137 138  ?Potassium 3.5 - 5.1 mmol/L 4.1 4.4 4.4  ?Chloride 98 - 111 mmol/L 104 107 107  ?CO2 22 - 32 mmol/L '27 24 25  '$ ?Calcium 8.9 - 10.3 mg/dL 9.6 8.9 8.9  ?Total Protein 6.5 - 8.1 g/dL 7.0 7.2 6.9  ?Total Bilirubin 0.3 - 1.2 mg/dL 0.3 0.4 0.3  ?Alkaline Phos 38 - 126 U/L 75 77 85  ?AST 15 - 41 U/L '25 28 26  '$ ?ALT 0 - 44 U/L '26 28 30  '$ ? ?TSH 3.142 ? ?PATHOLOGY  ? ?Diagnosis 12/25/16 ? ? ?CONSULT SLIDE, LEFT NECK LEVEL 2 ?MALIGNANT CELLS PRESENT, CONSISTENT WITH SQUAMOUS CELL CARCINOMA. ?04/16/2017 ? ?03/17/2017 ? ?02/20/2017 ? ? ?Surgical Pathology Tissue ExamResulted: 08/19/2017 4:44 PM ?[facility] ?Specimen Collected on  ?Lymph Node 08/15/2017 9:02 PM  ?Result Narrative  ? ? ?ACCESSION NUMBER:  [id number] ?RECEIVED: 08/15/2017 ?ORDERING PHYSICIAN:  Bolivar Haw , MD ?PATIENT NAME:  Tracy [name] ?SURGICAL PATHOLOGY REPORT ? ?FINAL PATHOLOGIC DIAGNOSIS ?MICROSCOPIC EXAMINATION AND DIAGNOSIS ? ? "LEFT LEVEL 3&5 LYMPH  NODE", EXCISON: ?     Metastatic squamous cell carcinoma involving one of eight ?lymph nodes (1/8). ? ? ? ?I have personally reviewed the slides and/or other related ?materials referenced, and have edited the report as part of my ?pathologic assessment and final interpretation. ? ?Electronically Signed Out By:   Sherlon Handing , MD ?08/19/2017 16:44:05  ? ? ?RADIOGRAPHIC STUDIES: ?I have personally reviewed the radiological images as listed and agreed with the findings in the report. ? ?PET/CT 07/16/2017 ?PET HEAD AND NECK CANCER (W/LOW DOSE CT)07/16/2017 ?[facility] ?Result Impression  ? ?1.  Hypermetabolic left level III cervical lymph node consistent with metastatic disease.  ?2.  Additional nonhypermetabolic subcentimeter left level V cervical lymph nodes are also noted. They are of questionable significance  ?3.  Focal area of increased uptake in the posterior left liver, of questionable significance. Recommend MRI of the liver for further evaluation. ?4.  Apparent bladder wall thickening which may in part be due to underdistention. Consider urinalysis and urology consult if clinically indicated. ?5.  Ancillary CT findings as above.  ? ? ?PET 01/14/17 ?IMPRESSION: ?1. Focal area of FDG uptake in the left lateral pharynx/upper left ?tonsillar region likely representing the primary neoplasm. ?2. Metastatic hypermetabolic left level 2 neck nodes. No other FDG ?positive nodal stations or contralateral adenopathy. ?3. No findings for metastatic disease. ? ? ?CT Soft Tissue W Contrast 01/03/17 ?IMPRESSION: ?1. Abnormally enlarged, solid left level 2 lymph node up to 4.3 cm ?corresponds to the palpable area of concern. ?Other left level 2 and level 3 nodes are normal by size criteria but ?asymmetrically increased. No contralateral lymphadenopathy. No ?primary tumor identified. ?2. Burtis Junes this is metastatic nodal disease from an occult ?pharyngeal/laryngeal primary. Less likely  differential ?considerations are lymphoproliferative disorder, lymphoma, or ?metastatic disease from a regional skin cancer or a distant primary. ?3. Tiny right apical lung nodules appear postinflammatory. ? ? ?ASSESSMENT & PLAN:

## 2021-08-29 DIAGNOSIS — C099 Malignant neoplasm of tonsil, unspecified: Secondary | ICD-10-CM | POA: Diagnosis not present

## 2021-10-15 DIAGNOSIS — Z6828 Body mass index (BMI) 28.0-28.9, adult: Secondary | ICD-10-CM | POA: Diagnosis not present

## 2021-10-15 DIAGNOSIS — R0789 Other chest pain: Secondary | ICD-10-CM | POA: Diagnosis not present

## 2021-10-22 DIAGNOSIS — R0781 Pleurodynia: Secondary | ICD-10-CM | POA: Diagnosis not present

## 2021-10-22 DIAGNOSIS — I10 Essential (primary) hypertension: Secondary | ICD-10-CM | POA: Diagnosis not present

## 2021-10-22 DIAGNOSIS — Z6829 Body mass index (BMI) 29.0-29.9, adult: Secondary | ICD-10-CM | POA: Diagnosis not present

## 2021-11-22 DIAGNOSIS — R5383 Other fatigue: Secondary | ICD-10-CM | POA: Diagnosis not present

## 2021-11-22 DIAGNOSIS — I10 Essential (primary) hypertension: Secondary | ICD-10-CM | POA: Diagnosis not present

## 2021-11-22 DIAGNOSIS — E782 Mixed hyperlipidemia: Secondary | ICD-10-CM | POA: Diagnosis not present

## 2021-11-22 DIAGNOSIS — E11649 Type 2 diabetes mellitus with hypoglycemia without coma: Secondary | ICD-10-CM | POA: Diagnosis not present

## 2021-11-22 DIAGNOSIS — Z125 Encounter for screening for malignant neoplasm of prostate: Secondary | ICD-10-CM | POA: Diagnosis not present

## 2021-11-22 DIAGNOSIS — E7849 Other hyperlipidemia: Secondary | ICD-10-CM | POA: Diagnosis not present

## 2021-11-22 DIAGNOSIS — Z1329 Encounter for screening for other suspected endocrine disorder: Secondary | ICD-10-CM | POA: Diagnosis not present

## 2021-11-22 DIAGNOSIS — R5382 Chronic fatigue, unspecified: Secondary | ICD-10-CM | POA: Diagnosis not present

## 2021-11-29 DIAGNOSIS — E1165 Type 2 diabetes mellitus with hyperglycemia: Secondary | ICD-10-CM | POA: Diagnosis not present

## 2021-11-29 DIAGNOSIS — E7849 Other hyperlipidemia: Secondary | ICD-10-CM | POA: Diagnosis not present

## 2021-11-29 DIAGNOSIS — Z6829 Body mass index (BMI) 29.0-29.9, adult: Secondary | ICD-10-CM | POA: Diagnosis not present

## 2021-11-29 DIAGNOSIS — Z0001 Encounter for general adult medical examination with abnormal findings: Secondary | ICD-10-CM | POA: Diagnosis not present

## 2021-11-29 DIAGNOSIS — I10 Essential (primary) hypertension: Secondary | ICD-10-CM | POA: Diagnosis not present

## 2021-11-29 DIAGNOSIS — I700 Atherosclerosis of aorta: Secondary | ICD-10-CM | POA: Diagnosis not present

## 2021-12-27 DIAGNOSIS — H25813 Combined forms of age-related cataract, bilateral: Secondary | ICD-10-CM | POA: Diagnosis not present

## 2022-01-01 ENCOUNTER — Ambulatory Visit: Payer: Self-pay | Admitting: *Deleted

## 2022-01-02 ENCOUNTER — Encounter: Payer: Self-pay | Admitting: *Deleted

## 2022-01-02 NOTE — Patient Outreach (Signed)
  Care Coordination   Initial Visit Note   01/02/2022 Name: QUALYN OYERVIDES MRN: [id number] DOB: Jan 13, 1951  Tamera Stands Fahl is a 71 y.o. year old male who sees [name], MD for primary care. I spoke with patient's wife, Christofer Shen by phone today.  What matters to the patients health and wellness today?  No Intervention Indicated.    Goals Addressed   None     SDOH assessments and interventions completed:  Yes  SDOH Interventions Today    Flowsheet Row Most Recent Value  SDOH Interventions   Food Insecurity Interventions Intervention Not Indicated, Other (Comment)  [Verified by Wife - Debbie Mccaul]  Financial Strain Interventions Intervention Not Indicated, Other (Comment)  [Verified by Wife - [place] Interventions Intervention Not Indicated, Other (Comment)  [Verified by Wife - Debbie Tucholski]  Physical Activity Interventions Intervention Not Indicated, Other (Comments)  [Verified by Wife - Debbie Shanker]  Stress Interventions Intervention Not Indicated, Other (Comment)  [Verified by Wife - Debbie Picco]  Social Connections Interventions Intervention Not Indicated, Other (Comment)  [Verified by Wife - Debbie Gaspari]  Transportation Interventions Intervention Not Indicated, Other (Comment)  [Verified by Wife - [place] Coordination Interventions Activated:  No  Care Coordination Interventions:  No, not indicated.   Follow up plan: No further intervention required.   Encounter Outcome:  Pt. Visit Completed.   Nat Christen, BSW, MSW, LCSW  Licensed Education officer, [facility]  [address] [place], [place] [address] Physical Address-[address], [place], [place] [address] Toll Free Main # [phone] Fax # [phone] Cell # [phone] [email or web address]

## 2022-01-02 NOTE — Patient Instructions (Signed)
Visit Information  Thank you for taking time to visit with me today. Please don't hesitate to contact me if I can be of assistance to you.   Please call the care guide team at [phone] if you need to cancel or reschedule your appointment.   If you are experiencing a Mental Health or Behavioral Health Crisis or need someone to talk to, please call the Suicide and Crisis Lifeline: 988 call the [place] National Suicide Prevention Lifeline: 1-800-273-8255 or TTY: 1-800-799-4 TTY (1-800-799-4889) to talk to a trained counselor call 1-800-273-TALK (toll free, 24 hour hotline) go to [facility] [address], [place] ([phone]) call the [place] Crisis Line: 800-939-9988 call 911  Patient verbalizes understanding of instructions and care plan provided today and agrees to view in MyChart. Active MyChart status and patient understanding of how to access instructions and care plan via MyChart confirmed with patient.     No further follow up required.  Winta Barcelo, BSW, MSW, LCSW  Licensed Clinical Social Worker  [facility]  Mailing Address-[address], [place], [place] [address] Physical Address-[address], , [place] [address] Toll Free Main # [phone] Fax # [phone] Cell # [phone] [email or web address]            

## 2022-02-08 DIAGNOSIS — H2513 Age-related nuclear cataract, bilateral: Secondary | ICD-10-CM | POA: Diagnosis not present

## 2022-02-08 DIAGNOSIS — E119 Type 2 diabetes mellitus without complications: Secondary | ICD-10-CM | POA: Diagnosis not present

## 2022-02-08 DIAGNOSIS — H2511 Age-related nuclear cataract, right eye: Secondary | ICD-10-CM | POA: Diagnosis not present

## 2022-02-08 DIAGNOSIS — H401131 Primary open-angle glaucoma, bilateral, mild stage: Secondary | ICD-10-CM | POA: Diagnosis not present

## 2022-02-08 DIAGNOSIS — H25013 Cortical age-related cataract, bilateral: Secondary | ICD-10-CM | POA: Diagnosis not present

## 2022-02-08 DIAGNOSIS — H25043 Posterior subcapsular polar age-related cataract, bilateral: Secondary | ICD-10-CM | POA: Diagnosis not present

## 2022-02-16 DIAGNOSIS — X58XXXA Exposure to other specified factors, initial encounter: Secondary | ICD-10-CM | POA: Diagnosis not present

## 2022-02-16 DIAGNOSIS — S0591XA Unspecified injury of right eye and orbit, initial encounter: Secondary | ICD-10-CM | POA: Diagnosis not present

## 2022-02-18 DIAGNOSIS — H1131 Conjunctival hemorrhage, right eye: Secondary | ICD-10-CM | POA: Diagnosis not present

## 2022-03-28 DIAGNOSIS — E7849 Other hyperlipidemia: Secondary | ICD-10-CM | POA: Diagnosis not present

## 2022-03-28 DIAGNOSIS — E11649 Type 2 diabetes mellitus with hypoglycemia without coma: Secondary | ICD-10-CM | POA: Diagnosis not present

## 2022-03-28 DIAGNOSIS — R5383 Other fatigue: Secondary | ICD-10-CM | POA: Diagnosis not present

## 2022-03-28 DIAGNOSIS — I10 Essential (primary) hypertension: Secondary | ICD-10-CM | POA: Diagnosis not present

## 2022-04-02 DIAGNOSIS — Z23 Encounter for immunization: Secondary | ICD-10-CM | POA: Diagnosis not present

## 2022-04-02 DIAGNOSIS — I700 Atherosclerosis of aorta: Secondary | ICD-10-CM | POA: Diagnosis not present

## 2022-04-02 DIAGNOSIS — E1165 Type 2 diabetes mellitus with hyperglycemia: Secondary | ICD-10-CM | POA: Diagnosis not present

## 2022-04-02 DIAGNOSIS — Z6829 Body mass index (BMI) 29.0-29.9, adult: Secondary | ICD-10-CM | POA: Diagnosis not present

## 2022-04-02 DIAGNOSIS — E7849 Other hyperlipidemia: Secondary | ICD-10-CM | POA: Diagnosis not present

## 2022-04-02 DIAGNOSIS — I10 Essential (primary) hypertension: Secondary | ICD-10-CM | POA: Diagnosis not present

## 2022-04-04 DIAGNOSIS — H401111 Primary open-angle glaucoma, right eye, mild stage: Secondary | ICD-10-CM | POA: Diagnosis not present

## 2022-04-04 DIAGNOSIS — H2511 Age-related nuclear cataract, right eye: Secondary | ICD-10-CM | POA: Diagnosis not present

## 2022-04-05 DIAGNOSIS — H25012 Cortical age-related cataract, left eye: Secondary | ICD-10-CM | POA: Diagnosis not present

## 2022-04-05 DIAGNOSIS — H25042 Posterior subcapsular polar age-related cataract, left eye: Secondary | ICD-10-CM | POA: Diagnosis not present

## 2022-04-05 DIAGNOSIS — H2512 Age-related nuclear cataract, left eye: Secondary | ICD-10-CM | POA: Diagnosis not present

## 2022-05-02 DIAGNOSIS — H401121 Primary open-angle glaucoma, left eye, mild stage: Secondary | ICD-10-CM | POA: Diagnosis not present

## 2022-05-02 DIAGNOSIS — H25012 Cortical age-related cataract, left eye: Secondary | ICD-10-CM | POA: Diagnosis not present

## 2022-05-02 DIAGNOSIS — H25042 Posterior subcapsular polar age-related cataract, left eye: Secondary | ICD-10-CM | POA: Diagnosis not present

## 2022-05-02 DIAGNOSIS — H2512 Age-related nuclear cataract, left eye: Secondary | ICD-10-CM | POA: Diagnosis not present

## 2022-09-10 DIAGNOSIS — R030 Elevated blood-pressure reading, without diagnosis of hypertension: Secondary | ICD-10-CM | POA: Diagnosis not present

## 2022-09-10 DIAGNOSIS — Z6829 Body mass index (BMI) 29.0-29.9, adult: Secondary | ICD-10-CM | POA: Diagnosis not present

## 2022-09-10 DIAGNOSIS — L03113 Cellulitis of right upper limb: Secondary | ICD-10-CM | POA: Diagnosis not present

## 2022-09-11 DIAGNOSIS — R1312 Dysphagia, oropharyngeal phase: Secondary | ICD-10-CM | POA: Diagnosis not present

## 2022-09-11 DIAGNOSIS — C099 Malignant neoplasm of tonsil, unspecified: Secondary | ICD-10-CM | POA: Diagnosis not present

## 2022-09-11 DIAGNOSIS — Z85818 Personal history of malignant neoplasm of other sites of lip, oral cavity, and pharynx: Secondary | ICD-10-CM | POA: Diagnosis not present

## 2022-09-11 DIAGNOSIS — Z923 Personal history of irradiation: Secondary | ICD-10-CM | POA: Diagnosis not present

## 2022-11-27 DIAGNOSIS — C44311 Basal cell carcinoma of skin of nose: Secondary | ICD-10-CM | POA: Diagnosis not present

## 2022-12-31 DIAGNOSIS — Z08 Encounter for follow-up examination after completed treatment for malignant neoplasm: Secondary | ICD-10-CM | POA: Diagnosis not present

## 2022-12-31 DIAGNOSIS — Z85828 Personal history of other malignant neoplasm of skin: Secondary | ICD-10-CM | POA: Diagnosis not present

## 2023-01-01 DIAGNOSIS — E119 Type 2 diabetes mellitus without complications: Secondary | ICD-10-CM | POA: Diagnosis not present

## 2023-01-03 DIAGNOSIS — E782 Mixed hyperlipidemia: Secondary | ICD-10-CM | POA: Diagnosis not present

## 2023-01-03 DIAGNOSIS — E1122 Type 2 diabetes mellitus with diabetic chronic kidney disease: Secondary | ICD-10-CM | POA: Diagnosis not present

## 2023-01-03 DIAGNOSIS — Z125 Encounter for screening for malignant neoplasm of prostate: Secondary | ICD-10-CM | POA: Diagnosis not present

## 2023-01-03 DIAGNOSIS — E1165 Type 2 diabetes mellitus with hyperglycemia: Secondary | ICD-10-CM | POA: Diagnosis not present

## 2023-01-03 DIAGNOSIS — N400 Enlarged prostate without lower urinary tract symptoms: Secondary | ICD-10-CM | POA: Diagnosis not present

## 2023-01-03 DIAGNOSIS — Z1329 Encounter for screening for other suspected endocrine disorder: Secondary | ICD-10-CM | POA: Diagnosis not present

## 2023-01-03 DIAGNOSIS — Z0000 Encounter for general adult medical examination without abnormal findings: Secondary | ICD-10-CM | POA: Diagnosis not present

## 2023-01-03 DIAGNOSIS — E039 Hypothyroidism, unspecified: Secondary | ICD-10-CM | POA: Diagnosis not present

## 2023-01-03 DIAGNOSIS — R5382 Chronic fatigue, unspecified: Secondary | ICD-10-CM | POA: Diagnosis not present

## 2023-01-03 DIAGNOSIS — E7849 Other hyperlipidemia: Secondary | ICD-10-CM | POA: Diagnosis not present

## 2023-01-03 DIAGNOSIS — R42 Dizziness and giddiness: Secondary | ICD-10-CM | POA: Diagnosis not present

## 2023-01-06 DIAGNOSIS — I10 Essential (primary) hypertension: Secondary | ICD-10-CM | POA: Diagnosis not present

## 2023-01-06 DIAGNOSIS — Z6829 Body mass index (BMI) 29.0-29.9, adult: Secondary | ICD-10-CM | POA: Diagnosis not present

## 2023-01-06 DIAGNOSIS — E7849 Other hyperlipidemia: Secondary | ICD-10-CM | POA: Diagnosis not present

## 2023-01-06 DIAGNOSIS — E1165 Type 2 diabetes mellitus with hyperglycemia: Secondary | ICD-10-CM | POA: Diagnosis not present

## 2023-01-06 DIAGNOSIS — I700 Atherosclerosis of aorta: Secondary | ICD-10-CM | POA: Diagnosis not present

## 2023-01-06 DIAGNOSIS — Z0001 Encounter for general adult medical examination with abnormal findings: Secondary | ICD-10-CM | POA: Diagnosis not present

## 2023-02-23 DIAGNOSIS — E119 Type 2 diabetes mellitus without complications: Secondary | ICD-10-CM | POA: Diagnosis not present

## 2023-02-23 DIAGNOSIS — M9909 Segmental and somatic dysfunction of abdomen and other regions: Secondary | ICD-10-CM | POA: Diagnosis not present

## 2023-02-23 DIAGNOSIS — S39012A Strain of muscle, fascia and tendon of lower back, initial encounter: Secondary | ICD-10-CM | POA: Diagnosis not present

## 2023-02-23 DIAGNOSIS — M9903 Segmental and somatic dysfunction of lumbar region: Secondary | ICD-10-CM | POA: Diagnosis not present

## 2023-02-23 DIAGNOSIS — X501XXA Overexertion from prolonged static or awkward postures, initial encounter: Secondary | ICD-10-CM | POA: Diagnosis not present

## 2023-02-24 DIAGNOSIS — M47816 Spondylosis without myelopathy or radiculopathy, lumbar region: Secondary | ICD-10-CM | POA: Diagnosis not present

## 2023-02-24 DIAGNOSIS — M9903 Segmental and somatic dysfunction of lumbar region: Secondary | ICD-10-CM | POA: Diagnosis not present

## 2023-02-24 DIAGNOSIS — S39012A Strain of muscle, fascia and tendon of lower back, initial encounter: Secondary | ICD-10-CM | POA: Diagnosis not present

## 2023-02-25 DIAGNOSIS — M47816 Spondylosis without myelopathy or radiculopathy, lumbar region: Secondary | ICD-10-CM | POA: Diagnosis not present

## 2023-02-25 DIAGNOSIS — M9903 Segmental and somatic dysfunction of lumbar region: Secondary | ICD-10-CM | POA: Diagnosis not present

## 2023-02-25 DIAGNOSIS — S39012A Strain of muscle, fascia and tendon of lower back, initial encounter: Secondary | ICD-10-CM | POA: Diagnosis not present

## 2023-02-26 DIAGNOSIS — M47816 Spondylosis without myelopathy or radiculopathy, lumbar region: Secondary | ICD-10-CM | POA: Diagnosis not present

## 2023-02-26 DIAGNOSIS — S39012A Strain of muscle, fascia and tendon of lower back, initial encounter: Secondary | ICD-10-CM | POA: Diagnosis not present

## 2023-02-26 DIAGNOSIS — M9903 Segmental and somatic dysfunction of lumbar region: Secondary | ICD-10-CM | POA: Diagnosis not present

## 2023-02-27 DIAGNOSIS — S39012A Strain of muscle, fascia and tendon of lower back, initial encounter: Secondary | ICD-10-CM | POA: Diagnosis not present

## 2023-02-27 DIAGNOSIS — M47816 Spondylosis without myelopathy or radiculopathy, lumbar region: Secondary | ICD-10-CM | POA: Diagnosis not present

## 2023-02-27 DIAGNOSIS — M9903 Segmental and somatic dysfunction of lumbar region: Secondary | ICD-10-CM | POA: Diagnosis not present

## 2023-02-28 DIAGNOSIS — M47816 Spondylosis without myelopathy or radiculopathy, lumbar region: Secondary | ICD-10-CM | POA: Diagnosis not present

## 2023-02-28 DIAGNOSIS — S39012A Strain of muscle, fascia and tendon of lower back, initial encounter: Secondary | ICD-10-CM | POA: Diagnosis not present

## 2023-02-28 DIAGNOSIS — M9903 Segmental and somatic dysfunction of lumbar region: Secondary | ICD-10-CM | POA: Diagnosis not present

## 2023-03-03 DIAGNOSIS — S39012A Strain of muscle, fascia and tendon of lower back, initial encounter: Secondary | ICD-10-CM | POA: Diagnosis not present

## 2023-03-03 DIAGNOSIS — M9903 Segmental and somatic dysfunction of lumbar region: Secondary | ICD-10-CM | POA: Diagnosis not present

## 2023-03-03 DIAGNOSIS — M47816 Spondylosis without myelopathy or radiculopathy, lumbar region: Secondary | ICD-10-CM | POA: Diagnosis not present

## 2023-03-05 DIAGNOSIS — M47816 Spondylosis without myelopathy or radiculopathy, lumbar region: Secondary | ICD-10-CM | POA: Diagnosis not present

## 2023-03-05 DIAGNOSIS — S39012A Strain of muscle, fascia and tendon of lower back, initial encounter: Secondary | ICD-10-CM | POA: Diagnosis not present

## 2023-03-05 DIAGNOSIS — M9903 Segmental and somatic dysfunction of lumbar region: Secondary | ICD-10-CM | POA: Diagnosis not present

## 2023-03-07 DIAGNOSIS — M9903 Segmental and somatic dysfunction of lumbar region: Secondary | ICD-10-CM | POA: Diagnosis not present

## 2023-03-07 DIAGNOSIS — S39012A Strain of muscle, fascia and tendon of lower back, initial encounter: Secondary | ICD-10-CM | POA: Diagnosis not present

## 2023-03-07 DIAGNOSIS — M47816 Spondylosis without myelopathy or radiculopathy, lumbar region: Secondary | ICD-10-CM | POA: Diagnosis not present

## 2023-03-10 DIAGNOSIS — S39012A Strain of muscle, fascia and tendon of lower back, initial encounter: Secondary | ICD-10-CM | POA: Diagnosis not present

## 2023-03-10 DIAGNOSIS — M9903 Segmental and somatic dysfunction of lumbar region: Secondary | ICD-10-CM | POA: Diagnosis not present

## 2023-03-10 DIAGNOSIS — M47816 Spondylosis without myelopathy or radiculopathy, lumbar region: Secondary | ICD-10-CM | POA: Diagnosis not present

## 2023-05-14 DIAGNOSIS — H401130 Primary open-angle glaucoma, bilateral, stage unspecified: Secondary | ICD-10-CM | POA: Diagnosis not present

## 2023-06-23 DIAGNOSIS — Z1329 Encounter for screening for other suspected endocrine disorder: Secondary | ICD-10-CM | POA: Diagnosis not present

## 2023-06-23 DIAGNOSIS — E782 Mixed hyperlipidemia: Secondary | ICD-10-CM | POA: Diagnosis not present

## 2023-06-23 DIAGNOSIS — E7849 Other hyperlipidemia: Secondary | ICD-10-CM | POA: Diagnosis not present

## 2023-06-23 DIAGNOSIS — I10 Essential (primary) hypertension: Secondary | ICD-10-CM | POA: Diagnosis not present

## 2023-06-23 DIAGNOSIS — E119 Type 2 diabetes mellitus without complications: Secondary | ICD-10-CM | POA: Diagnosis not present

## 2023-06-23 DIAGNOSIS — E1165 Type 2 diabetes mellitus with hyperglycemia: Secondary | ICD-10-CM | POA: Diagnosis not present

## 2023-06-30 DIAGNOSIS — Z23 Encounter for immunization: Secondary | ICD-10-CM | POA: Diagnosis not present

## 2023-06-30 DIAGNOSIS — E1165 Type 2 diabetes mellitus with hyperglycemia: Secondary | ICD-10-CM | POA: Diagnosis not present

## 2023-06-30 DIAGNOSIS — E782 Mixed hyperlipidemia: Secondary | ICD-10-CM | POA: Diagnosis not present

## 2023-06-30 DIAGNOSIS — I700 Atherosclerosis of aorta: Secondary | ICD-10-CM | POA: Diagnosis not present

## 2023-06-30 DIAGNOSIS — Z6829 Body mass index (BMI) 29.0-29.9, adult: Secondary | ICD-10-CM | POA: Diagnosis not present

## 2023-06-30 DIAGNOSIS — I10 Essential (primary) hypertension: Secondary | ICD-10-CM | POA: Diagnosis not present

## 2023-09-17 DIAGNOSIS — C099 Malignant neoplasm of tonsil, unspecified: Secondary | ICD-10-CM | POA: Diagnosis not present

## 2023-09-17 DIAGNOSIS — R1312 Dysphagia, oropharyngeal phase: Secondary | ICD-10-CM | POA: Diagnosis not present

## 2023-10-23 DIAGNOSIS — E782 Mixed hyperlipidemia: Secondary | ICD-10-CM | POA: Diagnosis not present

## 2023-10-23 DIAGNOSIS — I10 Essential (primary) hypertension: Secondary | ICD-10-CM | POA: Diagnosis not present

## 2023-10-23 DIAGNOSIS — E1165 Type 2 diabetes mellitus with hyperglycemia: Secondary | ICD-10-CM | POA: Diagnosis not present

## 2023-10-23 DIAGNOSIS — E7849 Other hyperlipidemia: Secondary | ICD-10-CM | POA: Diagnosis not present

## 2023-10-23 DIAGNOSIS — R5383 Other fatigue: Secondary | ICD-10-CM | POA: Diagnosis not present

## 2023-10-30 DIAGNOSIS — I10 Essential (primary) hypertension: Secondary | ICD-10-CM | POA: Diagnosis not present

## 2023-10-30 DIAGNOSIS — Z6829 Body mass index (BMI) 29.0-29.9, adult: Secondary | ICD-10-CM | POA: Diagnosis not present

## 2023-10-30 DIAGNOSIS — E1165 Type 2 diabetes mellitus with hyperglycemia: Secondary | ICD-10-CM | POA: Diagnosis not present

## 2023-10-30 DIAGNOSIS — E782 Mixed hyperlipidemia: Secondary | ICD-10-CM | POA: Diagnosis not present

## 2023-10-30 DIAGNOSIS — I700 Atherosclerosis of aorta: Secondary | ICD-10-CM | POA: Diagnosis not present

## 2023-11-20 DIAGNOSIS — I10 Essential (primary) hypertension: Secondary | ICD-10-CM | POA: Diagnosis not present

## 2023-11-20 DIAGNOSIS — E782 Mixed hyperlipidemia: Secondary | ICD-10-CM | POA: Diagnosis not present

## 2023-11-20 DIAGNOSIS — E1165 Type 2 diabetes mellitus with hyperglycemia: Secondary | ICD-10-CM | POA: Diagnosis not present

## 2023-11-20 DIAGNOSIS — I700 Atherosclerosis of aorta: Secondary | ICD-10-CM | POA: Diagnosis not present

## 2023-11-20 DIAGNOSIS — Z6829 Body mass index (BMI) 29.0-29.9, adult: Secondary | ICD-10-CM | POA: Diagnosis not present

## 2024-01-01 DIAGNOSIS — E119 Type 2 diabetes mellitus without complications: Secondary | ICD-10-CM | POA: Diagnosis not present

## 2024-01-03 DIAGNOSIS — Z6828 Body mass index (BMI) 28.0-28.9, adult: Secondary | ICD-10-CM | POA: Diagnosis not present

## 2024-01-03 DIAGNOSIS — M25512 Pain in left shoulder: Secondary | ICD-10-CM | POA: Diagnosis not present

## 2024-01-14 DIAGNOSIS — S39012A Strain of muscle, fascia and tendon of lower back, initial encounter: Secondary | ICD-10-CM | POA: Diagnosis not present

## 2024-01-14 DIAGNOSIS — M47816 Spondylosis without myelopathy or radiculopathy, lumbar region: Secondary | ICD-10-CM | POA: Diagnosis not present

## 2024-01-14 DIAGNOSIS — M9903 Segmental and somatic dysfunction of lumbar region: Secondary | ICD-10-CM | POA: Diagnosis not present

## 2024-01-15 DIAGNOSIS — M9903 Segmental and somatic dysfunction of lumbar region: Secondary | ICD-10-CM | POA: Diagnosis not present

## 2024-01-15 DIAGNOSIS — S39012A Strain of muscle, fascia and tendon of lower back, initial encounter: Secondary | ICD-10-CM | POA: Diagnosis not present

## 2024-01-15 DIAGNOSIS — M47816 Spondylosis without myelopathy or radiculopathy, lumbar region: Secondary | ICD-10-CM | POA: Diagnosis not present

## 2024-01-16 DIAGNOSIS — E119 Type 2 diabetes mellitus without complications: Secondary | ICD-10-CM | POA: Diagnosis not present

## 2024-01-16 DIAGNOSIS — Z794 Long term (current) use of insulin: Secondary | ICD-10-CM | POA: Diagnosis not present

## 2024-01-16 DIAGNOSIS — H26491 Other secondary cataract, right eye: Secondary | ICD-10-CM | POA: Diagnosis not present

## 2024-01-16 DIAGNOSIS — H401131 Primary open-angle glaucoma, bilateral, mild stage: Secondary | ICD-10-CM | POA: Diagnosis not present

## 2024-01-20 DIAGNOSIS — M9903 Segmental and somatic dysfunction of lumbar region: Secondary | ICD-10-CM | POA: Diagnosis not present

## 2024-01-20 DIAGNOSIS — M47816 Spondylosis without myelopathy or radiculopathy, lumbar region: Secondary | ICD-10-CM | POA: Diagnosis not present

## 2024-01-20 DIAGNOSIS — S39012A Strain of muscle, fascia and tendon of lower back, initial encounter: Secondary | ICD-10-CM | POA: Diagnosis not present

## 2024-01-26 DIAGNOSIS — M47816 Spondylosis without myelopathy or radiculopathy, lumbar region: Secondary | ICD-10-CM | POA: Diagnosis not present

## 2024-01-26 DIAGNOSIS — S39012A Strain of muscle, fascia and tendon of lower back, initial encounter: Secondary | ICD-10-CM | POA: Diagnosis not present

## 2024-01-26 DIAGNOSIS — M9903 Segmental and somatic dysfunction of lumbar region: Secondary | ICD-10-CM | POA: Diagnosis not present

## 2024-02-04 DIAGNOSIS — M47816 Spondylosis without myelopathy or radiculopathy, lumbar region: Secondary | ICD-10-CM | POA: Diagnosis not present

## 2024-02-04 DIAGNOSIS — S39012A Strain of muscle, fascia and tendon of lower back, initial encounter: Secondary | ICD-10-CM | POA: Diagnosis not present

## 2024-02-04 DIAGNOSIS — M9903 Segmental and somatic dysfunction of lumbar region: Secondary | ICD-10-CM | POA: Diagnosis not present

## 2024-02-18 DIAGNOSIS — M9903 Segmental and somatic dysfunction of lumbar region: Secondary | ICD-10-CM | POA: Diagnosis not present

## 2024-02-18 DIAGNOSIS — M47816 Spondylosis without myelopathy or radiculopathy, lumbar region: Secondary | ICD-10-CM | POA: Diagnosis not present

## 2024-02-18 DIAGNOSIS — S39012A Strain of muscle, fascia and tendon of lower back, initial encounter: Secondary | ICD-10-CM | POA: Diagnosis not present

## 2024-03-02 DIAGNOSIS — M47816 Spondylosis without myelopathy or radiculopathy, lumbar region: Secondary | ICD-10-CM | POA: Diagnosis not present

## 2024-03-02 DIAGNOSIS — E1165 Type 2 diabetes mellitus with hyperglycemia: Secondary | ICD-10-CM | POA: Diagnosis not present

## 2024-03-02 DIAGNOSIS — Z125 Encounter for screening for malignant neoplasm of prostate: Secondary | ICD-10-CM | POA: Diagnosis not present

## 2024-03-02 DIAGNOSIS — R5383 Other fatigue: Secondary | ICD-10-CM | POA: Diagnosis not present

## 2024-03-02 DIAGNOSIS — M9903 Segmental and somatic dysfunction of lumbar region: Secondary | ICD-10-CM | POA: Diagnosis not present

## 2024-03-02 DIAGNOSIS — S39012A Strain of muscle, fascia and tendon of lower back, initial encounter: Secondary | ICD-10-CM | POA: Diagnosis not present

## 2024-03-02 DIAGNOSIS — E1122 Type 2 diabetes mellitus with diabetic chronic kidney disease: Secondary | ICD-10-CM | POA: Diagnosis not present

## 2024-03-02 DIAGNOSIS — E7849 Other hyperlipidemia: Secondary | ICD-10-CM | POA: Diagnosis not present

## 2024-03-02 DIAGNOSIS — D559 Anemia due to enzyme disorder, unspecified: Secondary | ICD-10-CM | POA: Diagnosis not present

## 2024-03-02 DIAGNOSIS — Z1329 Encounter for screening for other suspected endocrine disorder: Secondary | ICD-10-CM | POA: Diagnosis not present

## 2024-03-02 DIAGNOSIS — E559 Vitamin D deficiency, unspecified: Secondary | ICD-10-CM | POA: Diagnosis not present

## 2024-03-09 DIAGNOSIS — E782 Mixed hyperlipidemia: Secondary | ICD-10-CM | POA: Diagnosis not present

## 2024-03-09 DIAGNOSIS — Z0001 Encounter for general adult medical examination with abnormal findings: Secondary | ICD-10-CM | POA: Diagnosis not present

## 2024-03-09 DIAGNOSIS — I10 Essential (primary) hypertension: Secondary | ICD-10-CM | POA: Diagnosis not present

## 2024-03-09 DIAGNOSIS — Z1331 Encounter for screening for depression: Secondary | ICD-10-CM | POA: Diagnosis not present

## 2024-03-09 DIAGNOSIS — I700 Atherosclerosis of aorta: Secondary | ICD-10-CM | POA: Diagnosis not present

## 2024-03-09 DIAGNOSIS — Z23 Encounter for immunization: Secondary | ICD-10-CM | POA: Diagnosis not present

## 2024-03-09 DIAGNOSIS — Z1389 Encounter for screening for other disorder: Secondary | ICD-10-CM | POA: Diagnosis not present

## 2024-03-09 DIAGNOSIS — E1165 Type 2 diabetes mellitus with hyperglycemia: Secondary | ICD-10-CM | POA: Diagnosis not present

## 2024-03-09 DIAGNOSIS — Z2911 Encounter for prophylactic immunotherapy for respiratory syncytial virus (RSV): Secondary | ICD-10-CM | POA: Diagnosis not present

## 2024-03-09 DIAGNOSIS — Z6829 Body mass index (BMI) 29.0-29.9, adult: Secondary | ICD-10-CM | POA: Diagnosis not present

## 2024-03-12 DIAGNOSIS — E119 Type 2 diabetes mellitus without complications: Secondary | ICD-10-CM | POA: Diagnosis not present

## 2024-03-12 DIAGNOSIS — I10 Essential (primary) hypertension: Secondary | ICD-10-CM | POA: Diagnosis not present

## 2024-03-12 DIAGNOSIS — H401131 Primary open-angle glaucoma, bilateral, mild stage: Secondary | ICD-10-CM | POA: Diagnosis not present

## 2024-03-12 DIAGNOSIS — Z794 Long term (current) use of insulin: Secondary | ICD-10-CM | POA: Diagnosis not present

## 2024-03-29 DIAGNOSIS — M47816 Spondylosis without myelopathy or radiculopathy, lumbar region: Secondary | ICD-10-CM | POA: Diagnosis not present

## 2024-03-29 DIAGNOSIS — M9903 Segmental and somatic dysfunction of lumbar region: Secondary | ICD-10-CM | POA: Diagnosis not present

## 2024-03-29 DIAGNOSIS — S39012A Strain of muscle, fascia and tendon of lower back, initial encounter: Secondary | ICD-10-CM | POA: Diagnosis not present

## 2024-04-05 DIAGNOSIS — M6281 Muscle weakness (generalized): Secondary | ICD-10-CM | POA: Diagnosis not present

## 2024-04-05 DIAGNOSIS — M9903 Segmental and somatic dysfunction of lumbar region: Secondary | ICD-10-CM | POA: Diagnosis not present

## 2024-04-05 DIAGNOSIS — M47816 Spondylosis without myelopathy or radiculopathy, lumbar region: Secondary | ICD-10-CM | POA: Diagnosis not present

## 2024-04-05 DIAGNOSIS — S39012A Strain of muscle, fascia and tendon of lower back, initial encounter: Secondary | ICD-10-CM | POA: Diagnosis not present

## 2024-04-05 DIAGNOSIS — M7542 Impingement syndrome of left shoulder: Secondary | ICD-10-CM | POA: Diagnosis not present

## 2024-04-05 DIAGNOSIS — M25512 Pain in left shoulder: Secondary | ICD-10-CM | POA: Diagnosis not present

## 2024-04-06 DIAGNOSIS — Z1211 Encounter for screening for malignant neoplasm of colon: Secondary | ICD-10-CM | POA: Diagnosis not present

## 2024-04-06 DIAGNOSIS — Z1212 Encounter for screening for malignant neoplasm of rectum: Secondary | ICD-10-CM | POA: Diagnosis not present

## 2024-04-07 DIAGNOSIS — M25512 Pain in left shoulder: Secondary | ICD-10-CM | POA: Diagnosis not present

## 2024-04-07 DIAGNOSIS — M6281 Muscle weakness (generalized): Secondary | ICD-10-CM | POA: Diagnosis not present

## 2024-04-07 DIAGNOSIS — M7542 Impingement syndrome of left shoulder: Secondary | ICD-10-CM | POA: Diagnosis not present

## 2024-04-13 DIAGNOSIS — M7542 Impingement syndrome of left shoulder: Secondary | ICD-10-CM | POA: Diagnosis not present

## 2024-04-13 DIAGNOSIS — M6281 Muscle weakness (generalized): Secondary | ICD-10-CM | POA: Diagnosis not present

## 2024-04-13 DIAGNOSIS — M25512 Pain in left shoulder: Secondary | ICD-10-CM | POA: Diagnosis not present

## 2024-04-19 DIAGNOSIS — S39012A Strain of muscle, fascia and tendon of lower back, initial encounter: Secondary | ICD-10-CM | POA: Diagnosis not present

## 2024-04-19 DIAGNOSIS — M9903 Segmental and somatic dysfunction of lumbar region: Secondary | ICD-10-CM | POA: Diagnosis not present

## 2024-04-19 DIAGNOSIS — M47816 Spondylosis without myelopathy or radiculopathy, lumbar region: Secondary | ICD-10-CM | POA: Diagnosis not present

## 2024-05-03 DIAGNOSIS — M47816 Spondylosis without myelopathy or radiculopathy, lumbar region: Secondary | ICD-10-CM | POA: Diagnosis not present

## 2024-05-03 DIAGNOSIS — S39012A Strain of muscle, fascia and tendon of lower back, initial encounter: Secondary | ICD-10-CM | POA: Diagnosis not present

## 2024-05-03 DIAGNOSIS — M9903 Segmental and somatic dysfunction of lumbar region: Secondary | ICD-10-CM | POA: Diagnosis not present

## 2024-05-04 DIAGNOSIS — M6281 Muscle weakness (generalized): Secondary | ICD-10-CM | POA: Diagnosis not present

## 2024-05-04 DIAGNOSIS — M7542 Impingement syndrome of left shoulder: Secondary | ICD-10-CM | POA: Diagnosis not present

## 2024-05-04 DIAGNOSIS — M25512 Pain in left shoulder: Secondary | ICD-10-CM | POA: Diagnosis not present

## 2024-05-17 DIAGNOSIS — S39012A Strain of muscle, fascia and tendon of lower back, initial encounter: Secondary | ICD-10-CM | POA: Diagnosis not present

## 2024-05-17 DIAGNOSIS — M47816 Spondylosis without myelopathy or radiculopathy, lumbar region: Secondary | ICD-10-CM | POA: Diagnosis not present

## 2024-05-17 DIAGNOSIS — M9903 Segmental and somatic dysfunction of lumbar region: Secondary | ICD-10-CM | POA: Diagnosis not present
